# Patient Record
Sex: Female | Born: 1950
Health system: Southern US, Community
[De-identification: ages and names within clinical notes are randomized; demographics above are authoritative.]

## PROBLEM LIST (undated history)

## (undated) DIAGNOSIS — E0590 Thyrotoxicosis, unspecified without thyrotoxic crisis or storm: Secondary | ICD-10-CM

## (undated) DIAGNOSIS — E785 Hyperlipidemia, unspecified: Secondary | ICD-10-CM

## (undated) DIAGNOSIS — I10 Essential (primary) hypertension: Secondary | ICD-10-CM

## (undated) DIAGNOSIS — F411 Generalized anxiety disorder: Secondary | ICD-10-CM

---

## 2001-07-04 ENCOUNTER — Emergency Department (HOSPITAL_COMMUNITY): Admission: EM | Admit: 2001-07-04 | Discharge: 2001-07-04 | Payer: Self-pay | Admitting: *Deleted

## 2003-12-14 ENCOUNTER — Encounter: Admission: RE | Admit: 2003-12-14 | Discharge: 2003-12-14 | Payer: Self-pay | Admitting: Family Medicine

## 2004-01-23 ENCOUNTER — Emergency Department (HOSPITAL_COMMUNITY): Admission: EM | Admit: 2004-01-23 | Discharge: 2004-01-23 | Payer: Self-pay | Admitting: Emergency Medicine

## 2005-01-08 ENCOUNTER — Encounter: Admission: RE | Admit: 2005-01-08 | Discharge: 2005-01-08 | Payer: Self-pay | Admitting: Family Medicine

## 2006-03-15 ENCOUNTER — Encounter: Admission: RE | Admit: 2006-03-15 | Discharge: 2006-03-15 | Payer: Self-pay | Admitting: Family Medicine

## 2006-09-09 ENCOUNTER — Encounter: Admission: RE | Admit: 2006-09-09 | Discharge: 2006-09-09 | Payer: Self-pay | Admitting: Occupational Medicine

## 2008-05-30 ENCOUNTER — Encounter: Admission: RE | Admit: 2008-05-30 | Discharge: 2008-05-30 | Payer: Self-pay | Admitting: Occupational Medicine

## 2010-01-31 ENCOUNTER — Encounter: Admission: RE | Admit: 2010-01-31 | Discharge: 2010-01-31 | Payer: Self-pay | Admitting: Family Medicine

## 2010-07-26 IMAGING — CR DG KNEE COMPLETE 4+V*R*
4 series · 4 of 4 positions shown · non-contrast
Comparison: None

CLINICAL DATA: Fall with pain and swelling.

RIGHT KNEE - COMPLETE 4+ VIEW

[view not recorded (1 of 4)]
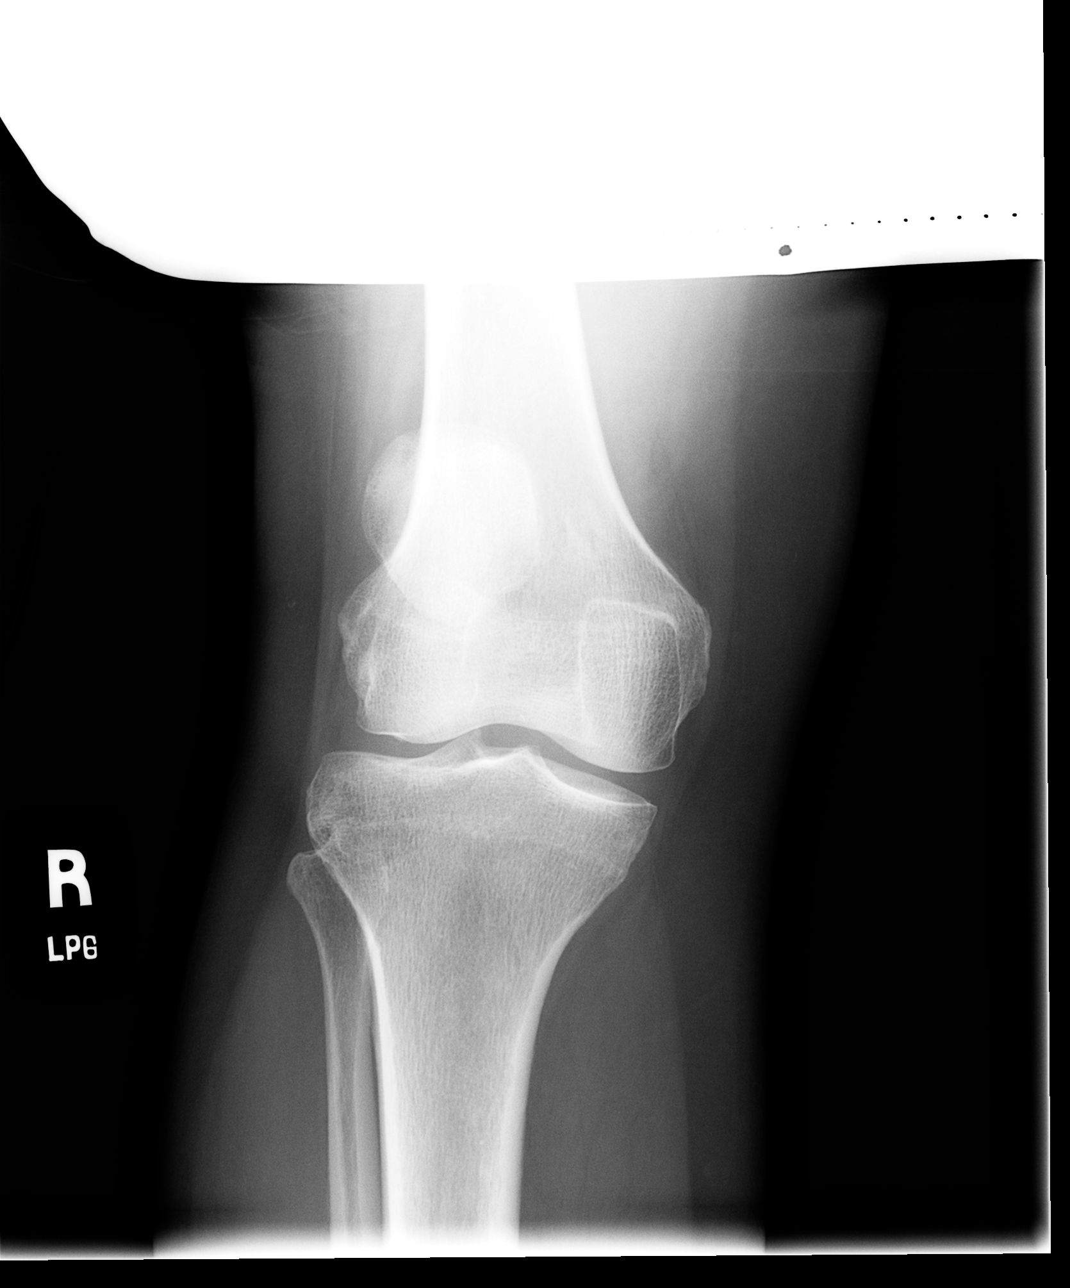

[view not recorded (2 of 4)]
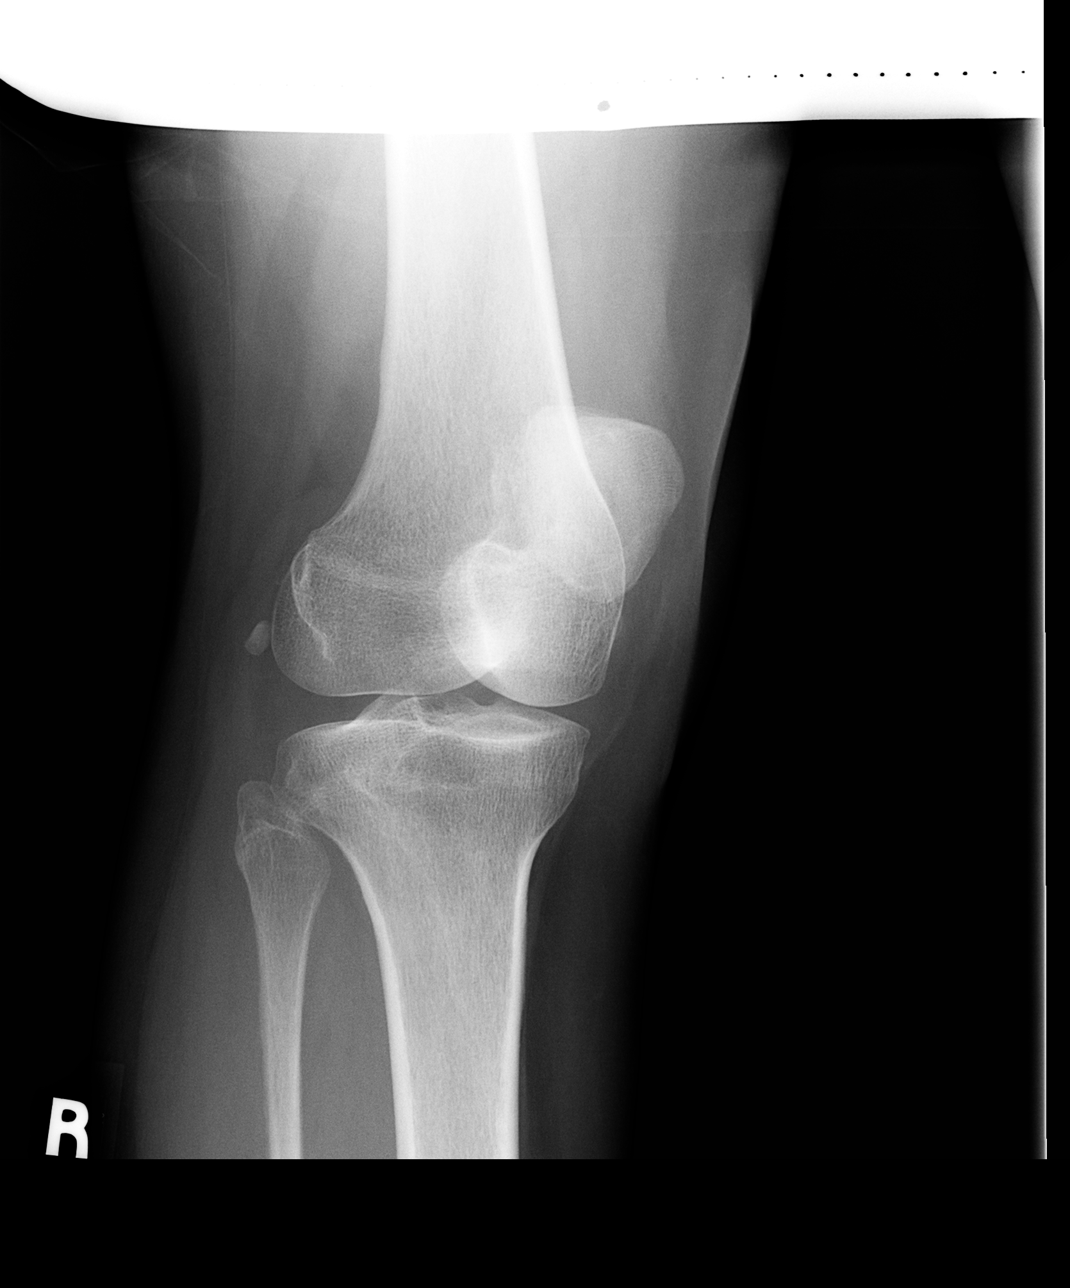

[view not recorded (3 of 4)]
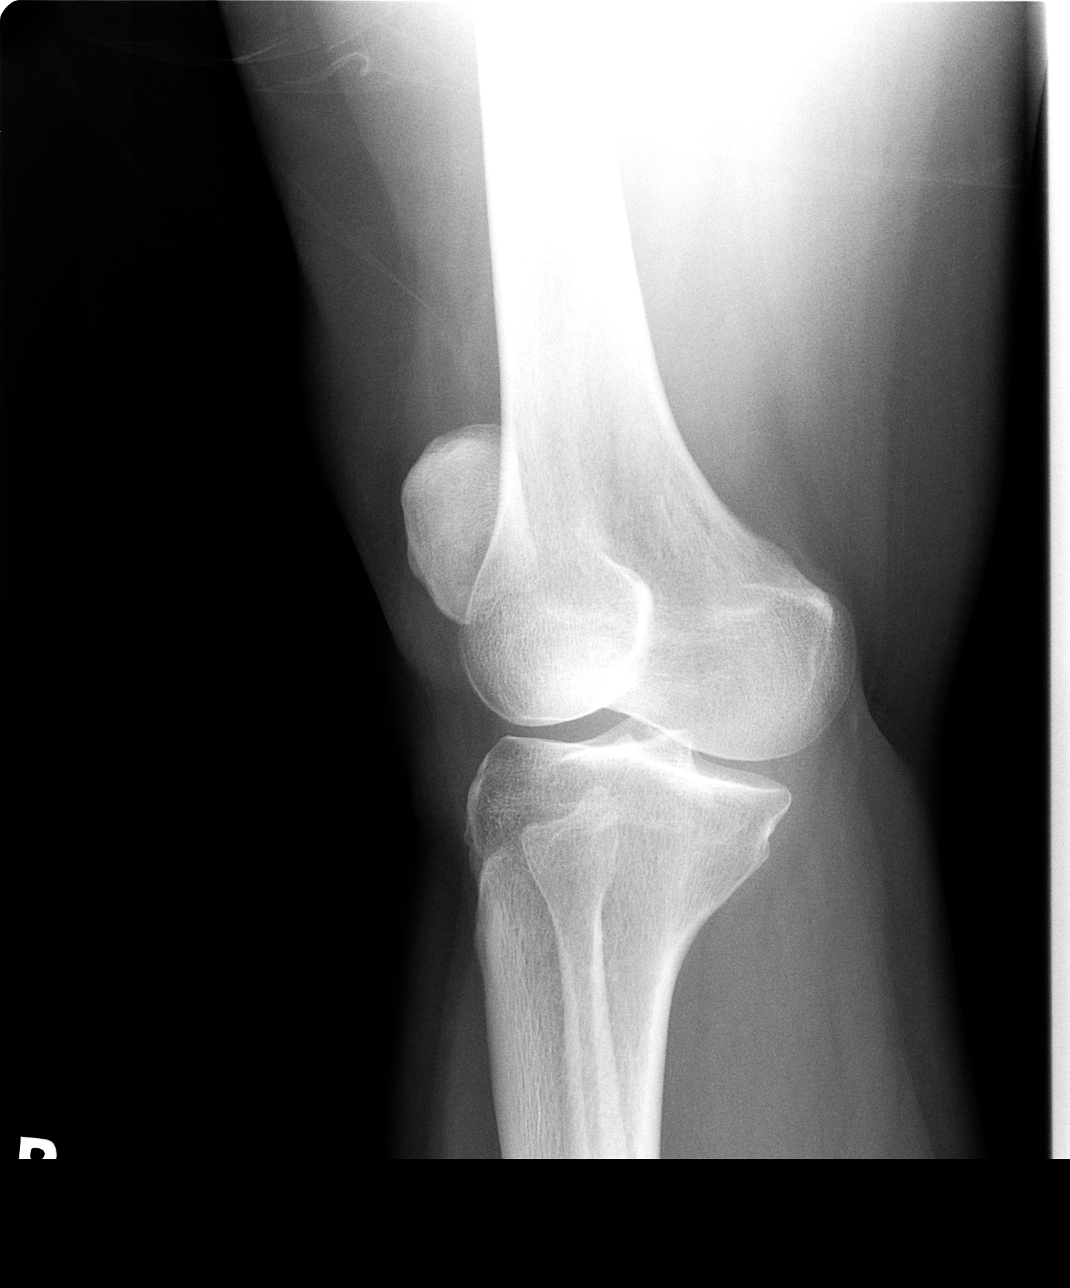

[view not recorded (4 of 4)]
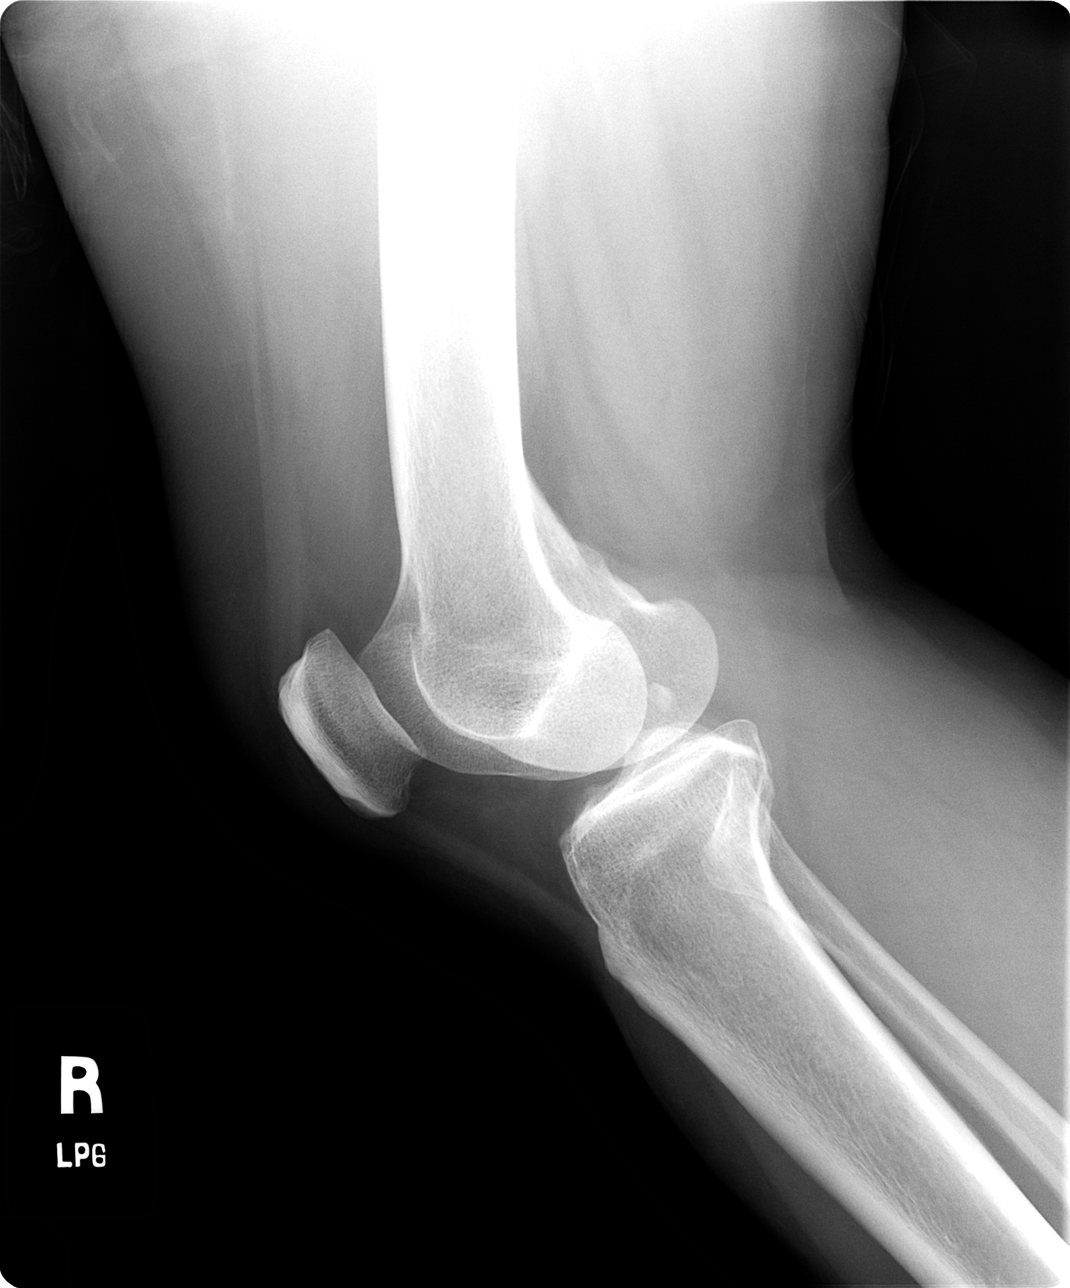

[4 of 4 positions shown; findings below may reference images not displayed]

FINDINGS: No joint effusion or fracture.  Medial and lateral
compartment joint space appears maintained.  Minimal subchondral
sclerosis.  There may be mild prepatellar soft tissue swelling.
IMPRESSION: 1.  Mild prepatellar soft tissue swelling without underlying
fracture.

## 2018-05-23 DIAGNOSIS — F321 Major depressive disorder, single episode, moderate: Secondary | ICD-10-CM | POA: Diagnosis not present

## 2018-05-23 DIAGNOSIS — F411 Generalized anxiety disorder: Secondary | ICD-10-CM | POA: Diagnosis not present

## 2018-05-23 DIAGNOSIS — R102 Pelvic and perineal pain: Secondary | ICD-10-CM | POA: Diagnosis not present

## 2018-05-23 DIAGNOSIS — N952 Postmenopausal atrophic vaginitis: Secondary | ICD-10-CM | POA: Diagnosis not present

## 2018-05-23 DIAGNOSIS — Z01419 Encounter for gynecological examination (general) (routine) without abnormal findings: Secondary | ICD-10-CM | POA: Diagnosis not present

## 2018-06-04 DIAGNOSIS — Z1389 Encounter for screening for other disorder: Secondary | ICD-10-CM | POA: Diagnosis not present

## 2018-06-04 DIAGNOSIS — E559 Vitamin D deficiency, unspecified: Secondary | ICD-10-CM | POA: Diagnosis not present

## 2018-06-04 DIAGNOSIS — I10 Essential (primary) hypertension: Secondary | ICD-10-CM | POA: Diagnosis not present

## 2018-06-04 DIAGNOSIS — F411 Generalized anxiety disorder: Secondary | ICD-10-CM | POA: Diagnosis not present

## 2018-06-04 DIAGNOSIS — R5383 Other fatigue: Secondary | ICD-10-CM | POA: Diagnosis not present

## 2018-09-23 DIAGNOSIS — F411 Generalized anxiety disorder: Secondary | ICD-10-CM | POA: Diagnosis not present

## 2018-09-23 DIAGNOSIS — N952 Postmenopausal atrophic vaginitis: Secondary | ICD-10-CM | POA: Diagnosis not present

## 2018-09-23 DIAGNOSIS — I10 Essential (primary) hypertension: Secondary | ICD-10-CM | POA: Diagnosis not present

## 2018-09-23 DIAGNOSIS — E0590 Thyrotoxicosis, unspecified without thyrotoxic crisis or storm: Secondary | ICD-10-CM | POA: Diagnosis not present

## 2018-09-23 DIAGNOSIS — E559 Vitamin D deficiency, unspecified: Secondary | ICD-10-CM | POA: Diagnosis not present

## 2019-01-16 DIAGNOSIS — E0590 Thyrotoxicosis, unspecified without thyrotoxic crisis or storm: Secondary | ICD-10-CM | POA: Diagnosis not present

## 2019-01-16 DIAGNOSIS — F411 Generalized anxiety disorder: Secondary | ICD-10-CM | POA: Diagnosis not present

## 2019-01-16 DIAGNOSIS — I10 Essential (primary) hypertension: Secondary | ICD-10-CM | POA: Diagnosis not present

## 2019-01-16 DIAGNOSIS — N952 Postmenopausal atrophic vaginitis: Secondary | ICD-10-CM | POA: Diagnosis not present

## 2019-01-16 DIAGNOSIS — F321 Major depressive disorder, single episode, moderate: Secondary | ICD-10-CM | POA: Diagnosis not present

## 2019-03-24 DIAGNOSIS — E0590 Thyrotoxicosis, unspecified without thyrotoxic crisis or storm: Secondary | ICD-10-CM | POA: Diagnosis not present

## 2019-03-24 DIAGNOSIS — Z719 Counseling, unspecified: Secondary | ICD-10-CM | POA: Diagnosis not present

## 2019-03-24 DIAGNOSIS — I10 Essential (primary) hypertension: Secondary | ICD-10-CM | POA: Diagnosis not present

## 2019-03-24 DIAGNOSIS — E559 Vitamin D deficiency, unspecified: Secondary | ICD-10-CM | POA: Diagnosis not present

## 2019-03-24 DIAGNOSIS — F411 Generalized anxiety disorder: Secondary | ICD-10-CM | POA: Diagnosis not present

## 2019-03-25 DIAGNOSIS — E559 Vitamin D deficiency, unspecified: Secondary | ICD-10-CM | POA: Diagnosis not present

## 2019-03-25 DIAGNOSIS — E0590 Thyrotoxicosis, unspecified without thyrotoxic crisis or storm: Secondary | ICD-10-CM | POA: Diagnosis not present

## 2019-03-25 DIAGNOSIS — I10 Essential (primary) hypertension: Secondary | ICD-10-CM | POA: Diagnosis not present

## 2019-08-07 DIAGNOSIS — Z20828 Contact with and (suspected) exposure to other viral communicable diseases: Secondary | ICD-10-CM | POA: Diagnosis not present

## 2019-10-18 DIAGNOSIS — F321 Major depressive disorder, single episode, moderate: Secondary | ICD-10-CM | POA: Diagnosis not present

## 2019-10-18 DIAGNOSIS — Z03818 Encounter for observation for suspected exposure to other biological agents ruled out: Secondary | ICD-10-CM | POA: Diagnosis not present

## 2019-10-18 DIAGNOSIS — Z6829 Body mass index (BMI) 29.0-29.9, adult: Secondary | ICD-10-CM | POA: Diagnosis not present

## 2019-10-18 DIAGNOSIS — I10 Essential (primary) hypertension: Secondary | ICD-10-CM | POA: Diagnosis not present

## 2019-10-18 DIAGNOSIS — E0590 Thyrotoxicosis, unspecified without thyrotoxic crisis or storm: Secondary | ICD-10-CM | POA: Diagnosis not present

## 2019-10-18 DIAGNOSIS — E049 Nontoxic goiter, unspecified: Secondary | ICD-10-CM | POA: Diagnosis not present

## 2020-06-08 ENCOUNTER — Other Ambulatory Visit (HOSPITAL_COMMUNITY): Payer: Self-pay | Admitting: Family Medicine

## 2020-06-08 DIAGNOSIS — Z0001 Encounter for general adult medical examination with abnormal findings: Secondary | ICD-10-CM | POA: Diagnosis not present

## 2020-06-08 DIAGNOSIS — E0590 Thyrotoxicosis, unspecified without thyrotoxic crisis or storm: Secondary | ICD-10-CM | POA: Diagnosis not present

## 2020-06-08 DIAGNOSIS — Z6829 Body mass index (BMI) 29.0-29.9, adult: Secondary | ICD-10-CM | POA: Diagnosis not present

## 2020-06-08 DIAGNOSIS — Z1331 Encounter for screening for depression: Secondary | ICD-10-CM | POA: Diagnosis not present

## 2020-06-08 DIAGNOSIS — I10 Essential (primary) hypertension: Secondary | ICD-10-CM | POA: Diagnosis not present

## 2020-06-08 DIAGNOSIS — E785 Hyperlipidemia, unspecified: Secondary | ICD-10-CM | POA: Diagnosis not present

## 2020-06-08 DIAGNOSIS — E049 Nontoxic goiter, unspecified: Secondary | ICD-10-CM | POA: Diagnosis not present

## 2020-06-08 DIAGNOSIS — R635 Abnormal weight gain: Secondary | ICD-10-CM | POA: Diagnosis not present

## 2020-06-08 DIAGNOSIS — F321 Major depressive disorder, single episode, moderate: Secondary | ICD-10-CM | POA: Diagnosis not present

## 2020-08-30 ENCOUNTER — Ambulatory Visit: Payer: Self-pay | Attending: Internal Medicine

## 2020-08-30 ENCOUNTER — Other Ambulatory Visit (HOSPITAL_BASED_OUTPATIENT_CLINIC_OR_DEPARTMENT_OTHER): Payer: Self-pay | Admitting: Internal Medicine

## 2020-08-30 DIAGNOSIS — Z23 Encounter for immunization: Secondary | ICD-10-CM

## 2020-08-30 NOTE — Progress Notes (Signed)
   Covid-19 Vaccination Clinic  Name:  Patricia Little    MRN: [id number] DOB: 10/30/1950  08/30/2020  Patricia Little was observed post Covid-19 immunization for 15 minutes without incident. She was provided with Vaccine Information Sheet and instruction to access the V-Safe system.  Vaccinated by [facility]  Patricia Little was instructed to call 911 with any severe reactions post vaccine: Marland Kitchen Difficulty breathing  . Swelling of face and throat  . A fast heartbeat  . A bad rash all over body  . Dizziness and weakness

## 2020-12-12 DIAGNOSIS — Z20822 Contact with and (suspected) exposure to covid-19: Secondary | ICD-10-CM | POA: Diagnosis not present

## 2021-01-08 DIAGNOSIS — Z20822 Contact with and (suspected) exposure to covid-19: Secondary | ICD-10-CM | POA: Diagnosis not present

## 2021-02-04 ENCOUNTER — Other Ambulatory Visit (HOSPITAL_COMMUNITY): Payer: Self-pay

## 2021-02-04 MED FILL — Losartan Potassium & Hydrochlorothiazide Tab 50-12.5 MG: ORAL | 30 days supply | Qty: 30 | Fill #0 | Status: AC

## 2021-02-04 MED FILL — Methimazole Tab 5 MG: ORAL | 30 days supply | Qty: 30 | Fill #0 | Status: AC

## 2021-02-06 ENCOUNTER — Other Ambulatory Visit (HOSPITAL_COMMUNITY): Payer: Self-pay

## 2021-02-10 ENCOUNTER — Other Ambulatory Visit (HOSPITAL_COMMUNITY): Payer: Self-pay

## 2021-02-11 ENCOUNTER — Other Ambulatory Visit (HOSPITAL_COMMUNITY): Payer: Self-pay

## 2021-02-11 MED FILL — Paroxetine HCl Tab ER 24HR 25 MG: ORAL | 30 days supply | Qty: 30 | Fill #0 | Status: CN

## 2021-03-10 ENCOUNTER — Other Ambulatory Visit (HOSPITAL_COMMUNITY): Payer: Self-pay

## 2021-03-10 MED FILL — Methimazole Tab 5 MG: ORAL | 30 days supply | Qty: 30 | Fill #1 | Status: AC

## 2021-03-10 MED FILL — Paroxetine HCl Tab ER 24HR 25 MG: ORAL | 30 days supply | Qty: 30 | Fill #0 | Status: AC

## 2021-03-11 ENCOUNTER — Other Ambulatory Visit (HOSPITAL_COMMUNITY): Payer: Self-pay

## 2021-03-14 ENCOUNTER — Other Ambulatory Visit (HOSPITAL_COMMUNITY): Payer: Self-pay

## 2021-03-19 ENCOUNTER — Other Ambulatory Visit (HOSPITAL_COMMUNITY): Payer: Self-pay

## 2021-04-04 ENCOUNTER — Other Ambulatory Visit (HOSPITAL_COMMUNITY): Payer: Self-pay

## 2021-04-04 DIAGNOSIS — E049 Nontoxic goiter, unspecified: Secondary | ICD-10-CM | POA: Diagnosis not present

## 2021-04-04 DIAGNOSIS — F411 Generalized anxiety disorder: Secondary | ICD-10-CM | POA: Diagnosis not present

## 2021-04-04 DIAGNOSIS — I10 Essential (primary) hypertension: Secondary | ICD-10-CM | POA: Diagnosis not present

## 2021-04-04 DIAGNOSIS — E785 Hyperlipidemia, unspecified: Secondary | ICD-10-CM | POA: Diagnosis not present

## 2021-04-04 DIAGNOSIS — F321 Major depressive disorder, single episode, moderate: Secondary | ICD-10-CM | POA: Diagnosis not present

## 2021-04-04 DIAGNOSIS — E0590 Thyrotoxicosis, unspecified without thyrotoxic crisis or storm: Secondary | ICD-10-CM | POA: Diagnosis not present

## 2021-04-04 MED ORDER — LOSARTAN POTASSIUM-HCTZ 50-12.5 MG PO TABS
1.0000 | ORAL_TABLET | Freq: Every day | ORAL | 2 refills | Status: DC
  Filled 2021-04-04: qty 90, 90d supply, fill #0
  Filled 2021-07-01: qty 90, 90d supply, fill #1
  Filled 2021-10-01: qty 90, 90d supply, fill #2

## 2021-04-04 MED ORDER — PAROXETINE HCL ER 25 MG PO TB24
25.0000 mg | ORAL_TABLET | Freq: Every day | ORAL | 2 refills | Status: AC
  Filled 2021-04-04: qty 30, 30d supply, fill #0
  Filled 2021-07-01: qty 30, 30d supply, fill #1
  Filled 2021-08-14: qty 30, 30d supply, fill #2
  Filled 2021-09-16: qty 30, 30d supply, fill #3
  Filled 2021-10-01: qty 30, 30d supply, fill #4
  Filled 2021-12-25: qty 30, 30d supply, fill #5
  Filled 2022-02-02 – 2022-02-23 (×2): qty 30, 30d supply, fill #6
  Filled 2022-03-26: qty 30, 30d supply, fill #7

## 2021-04-04 MED ORDER — METHIMAZOLE 5 MG PO TABS
5.0000 mg | ORAL_TABLET | Freq: Every day | ORAL | 4 refills | Status: AC
  Filled 2021-04-04: qty 90, 90d supply, fill #0
  Filled 2021-07-01: qty 90, 90d supply, fill #1
  Filled 2021-10-01: qty 90, 90d supply, fill #2
  Filled 2021-12-25: qty 90, 90d supply, fill #3
  Filled 2022-03-11: qty 90, 90d supply, fill #4

## 2021-04-22 DIAGNOSIS — E042 Nontoxic multinodular goiter: Secondary | ICD-10-CM | POA: Diagnosis not present

## 2021-04-22 DIAGNOSIS — Z13820 Encounter for screening for osteoporosis: Secondary | ICD-10-CM | POA: Diagnosis not present

## 2021-04-22 DIAGNOSIS — Z780 Asymptomatic menopausal state: Secondary | ICD-10-CM | POA: Diagnosis not present

## 2021-06-06 ENCOUNTER — Other Ambulatory Visit (HOSPITAL_COMMUNITY): Payer: Self-pay

## 2021-07-01 ENCOUNTER — Other Ambulatory Visit (HOSPITAL_COMMUNITY): Payer: Self-pay

## 2021-08-04 ENCOUNTER — Other Ambulatory Visit (HOSPITAL_BASED_OUTPATIENT_CLINIC_OR_DEPARTMENT_OTHER): Payer: Self-pay

## 2021-08-04 MED ORDER — INFLUENZA VAC A&B SA ADJ QUAD 0.5 ML IM PRSY
PREFILLED_SYRINGE | INTRAMUSCULAR | 0 refills | Status: DC
  Filled 2021-08-04: qty 0.5, 1d supply, fill #0

## 2021-08-14 ENCOUNTER — Other Ambulatory Visit (HOSPITAL_COMMUNITY): Payer: Self-pay

## 2021-09-16 ENCOUNTER — Other Ambulatory Visit (HOSPITAL_COMMUNITY): Payer: Self-pay

## 2021-10-01 ENCOUNTER — Other Ambulatory Visit (HOSPITAL_COMMUNITY): Payer: Self-pay

## 2021-10-02 ENCOUNTER — Other Ambulatory Visit (HOSPITAL_COMMUNITY): Payer: Self-pay

## 2021-10-03 ENCOUNTER — Other Ambulatory Visit (HOSPITAL_COMMUNITY): Payer: Self-pay

## 2021-10-21 ENCOUNTER — Other Ambulatory Visit (HOSPITAL_COMMUNITY): Payer: Self-pay

## 2021-10-21 MED ORDER — PAROXETINE HCL ER 25 MG PO TB24
25.0000 mg | ORAL_TABLET | Freq: Every day | ORAL | 2 refills | Status: DC
  Filled 2021-10-21: qty 90, 90d supply, fill #0
  Filled 2022-04-29: qty 30, 30d supply, fill #0
  Filled 2022-06-08: qty 30, 30d supply, fill #1

## 2021-10-21 MED ORDER — METHIMAZOLE 5 MG PO TABS
5.0000 mg | ORAL_TABLET | Freq: Every day | ORAL | 4 refills | Status: DC
  Filled 2021-10-21 – 2022-06-08 (×2): qty 90, 90d supply, fill #0

## 2021-10-21 MED ORDER — LOSARTAN POTASSIUM-HCTZ 50-12.5 MG PO TABS
1.0000 | ORAL_TABLET | Freq: Every day | ORAL | 2 refills | Status: AC
  Filled 2021-10-21 – 2021-12-25 (×2): qty 90, 90d supply, fill #0
  Filled 2022-03-16: qty 90, 90d supply, fill #1
  Filled 2022-06-08: qty 90, 90d supply, fill #2

## 2021-11-29 DIAGNOSIS — Z5320 Procedure and treatment not carried out because of patient's decision for unspecified reasons: Secondary | ICD-10-CM | POA: Diagnosis not present

## 2021-11-29 DIAGNOSIS — E785 Hyperlipidemia, unspecified: Secondary | ICD-10-CM | POA: Diagnosis not present

## 2021-11-29 DIAGNOSIS — F321 Major depressive disorder, single episode, moderate: Secondary | ICD-10-CM | POA: Diagnosis not present

## 2021-11-29 DIAGNOSIS — E049 Nontoxic goiter, unspecified: Secondary | ICD-10-CM | POA: Diagnosis not present

## 2021-11-29 DIAGNOSIS — R635 Abnormal weight gain: Secondary | ICD-10-CM | POA: Diagnosis not present

## 2021-11-29 DIAGNOSIS — E559 Vitamin D deficiency, unspecified: Secondary | ICD-10-CM | POA: Diagnosis not present

## 2021-11-29 DIAGNOSIS — Z6828 Body mass index (BMI) 28.0-28.9, adult: Secondary | ICD-10-CM | POA: Diagnosis not present

## 2021-11-29 DIAGNOSIS — I10 Essential (primary) hypertension: Secondary | ICD-10-CM | POA: Diagnosis not present

## 2021-11-29 DIAGNOSIS — E0590 Thyrotoxicosis, unspecified without thyrotoxic crisis or storm: Secondary | ICD-10-CM | POA: Diagnosis not present

## 2021-12-01 ENCOUNTER — Other Ambulatory Visit (HOSPITAL_COMMUNITY): Payer: Self-pay

## 2021-12-01 MED ORDER — ROSUVASTATIN CALCIUM 10 MG PO TABS
10.0000 mg | ORAL_TABLET | Freq: Every day | ORAL | 1 refills | Status: AC
  Filled 2021-12-01 – 2021-12-25 (×2): qty 90, 90d supply, fill #0
  Filled 2022-06-08: qty 90, 90d supply, fill #1

## 2021-12-09 ENCOUNTER — Other Ambulatory Visit (HOSPITAL_COMMUNITY): Payer: Self-pay

## 2021-12-25 ENCOUNTER — Other Ambulatory Visit (HOSPITAL_COMMUNITY): Payer: Self-pay

## 2021-12-25 MED ORDER — AMOXICILLIN 250 MG PO CAPS
250.0000 mg | ORAL_CAPSULE | ORAL | 0 refills | Status: DC
  Filled 2021-12-25: qty 40, 10d supply, fill #0

## 2022-01-08 ENCOUNTER — Other Ambulatory Visit (HOSPITAL_COMMUNITY): Payer: Self-pay

## 2022-01-08 MED ORDER — PENICILLIN V POTASSIUM 500 MG PO TABS
500.0000 mg | ORAL_TABLET | Freq: Four times a day (QID) | ORAL | 0 refills | Status: DC
  Filled 2022-01-08: qty 28, 7d supply, fill #0

## 2022-01-08 MED ORDER — HYDROCODONE-ACETAMINOPHEN 5-325 MG PO TABS
1.0000 | ORAL_TABLET | ORAL | 0 refills | Status: DC | PRN
  Filled 2022-01-08: qty 14, 3d supply, fill #0

## 2022-02-02 ENCOUNTER — Other Ambulatory Visit (HOSPITAL_COMMUNITY): Payer: Self-pay

## 2022-02-08 DIAGNOSIS — H524 Presbyopia: Secondary | ICD-10-CM | POA: Diagnosis not present

## 2022-02-10 ENCOUNTER — Other Ambulatory Visit (HOSPITAL_COMMUNITY): Payer: Self-pay

## 2022-02-23 ENCOUNTER — Other Ambulatory Visit (HOSPITAL_COMMUNITY): Payer: Self-pay

## 2022-03-11 ENCOUNTER — Other Ambulatory Visit (HOSPITAL_COMMUNITY): Payer: Self-pay

## 2022-03-16 ENCOUNTER — Other Ambulatory Visit (HOSPITAL_COMMUNITY): Payer: Self-pay

## 2022-03-26 ENCOUNTER — Other Ambulatory Visit (HOSPITAL_COMMUNITY): Payer: Self-pay

## 2022-04-29 ENCOUNTER — Other Ambulatory Visit (HOSPITAL_COMMUNITY): Payer: Self-pay

## 2022-04-30 ENCOUNTER — Other Ambulatory Visit (HOSPITAL_COMMUNITY): Payer: Self-pay

## 2022-06-08 ENCOUNTER — Other Ambulatory Visit (HOSPITAL_COMMUNITY): Payer: Self-pay

## 2022-07-15 ENCOUNTER — Other Ambulatory Visit (HOSPITAL_COMMUNITY): Payer: Self-pay

## 2022-07-16 ENCOUNTER — Other Ambulatory Visit (HOSPITAL_COMMUNITY): Payer: Self-pay

## 2022-08-18 ENCOUNTER — Other Ambulatory Visit (HOSPITAL_COMMUNITY): Payer: Self-pay

## 2024-09-09 ENCOUNTER — Other Ambulatory Visit: Payer: Self-pay

## 2024-09-09 ENCOUNTER — Observation Stay (HOSPITAL_BASED_OUTPATIENT_CLINIC_OR_DEPARTMENT_OTHER)
Admission: EM | Admit: 2024-09-09 | Discharge: 2024-09-11 | Disposition: A | Discharge status: Home / Self Care | Attending: Emergency Medicine | Admitting: Emergency Medicine

## 2024-09-09 ENCOUNTER — Emergency Department (HOSPITAL_BASED_OUTPATIENT_CLINIC_OR_DEPARTMENT_OTHER)

## 2024-09-09 ENCOUNTER — Encounter (HOSPITAL_BASED_OUTPATIENT_CLINIC_OR_DEPARTMENT_OTHER): Payer: Self-pay

## 2024-09-09 DIAGNOSIS — D72829 Elevated white blood cell count, unspecified: Secondary | ICD-10-CM | POA: Diagnosis present

## 2024-09-09 DIAGNOSIS — E0590 Thyrotoxicosis, unspecified without thyrotoxic crisis or storm: Secondary | ICD-10-CM | POA: Diagnosis not present

## 2024-09-09 DIAGNOSIS — K047 Periapical abscess without sinus: Secondary | ICD-10-CM | POA: Diagnosis not present

## 2024-09-09 DIAGNOSIS — F32A Depression, unspecified: Secondary | ICD-10-CM | POA: Insufficient documentation

## 2024-09-09 DIAGNOSIS — H53141 Visual discomfort, right eye: Secondary | ICD-10-CM | POA: Diagnosis present

## 2024-09-09 DIAGNOSIS — Z8639 Personal history of other endocrine, nutritional and metabolic disease: Secondary | ICD-10-CM

## 2024-09-09 DIAGNOSIS — I10 Essential (primary) hypertension: Secondary | ICD-10-CM | POA: Diagnosis not present

## 2024-09-09 DIAGNOSIS — Z79899 Other long term (current) drug therapy: Secondary | ICD-10-CM | POA: Insufficient documentation

## 2024-09-09 DIAGNOSIS — F419 Anxiety disorder, unspecified: Secondary | ICD-10-CM | POA: Insufficient documentation

## 2024-09-09 DIAGNOSIS — G459 Transient cerebral ischemic attack, unspecified: Principal | ICD-10-CM

## 2024-09-09 DIAGNOSIS — H5461 Unqualified visual loss, right eye, normal vision left eye: Secondary | ICD-10-CM | POA: Diagnosis present

## 2024-09-09 DIAGNOSIS — Z8679 Personal history of other diseases of the circulatory system: Secondary | ICD-10-CM

## 2024-09-09 DIAGNOSIS — E785 Hyperlipidemia, unspecified: Secondary | ICD-10-CM | POA: Diagnosis not present

## 2024-09-09 HISTORY — DX: Thyrotoxicosis, unspecified without thyrotoxic crisis or storm: E05.90

## 2024-09-09 HISTORY — DX: Generalized anxiety disorder: F41.1

## 2024-09-09 HISTORY — DX: Hyperlipidemia, unspecified: E78.5

## 2024-09-09 HISTORY — DX: Essential (primary) hypertension: I10

## 2024-09-09 LAB — DIFFERENTIAL
Abs Immature Granulocytes: 0.04 K/uL (ref 0.00–0.07)
Basophils Absolute: 0.1 K/uL (ref 0.0–0.1)
Basophils Relative: 1 %
Eosinophils Absolute: 0.1 K/uL (ref 0.0–0.5)
Eosinophils Relative: 1 %
Immature Granulocytes: 0 %
Lymphocytes Relative: 22 %
Lymphs Abs: 2.7 K/uL (ref 0.7–4.0)
Monocytes Absolute: 0.6 K/uL (ref 0.1–1.0)
Monocytes Relative: 5 %
Neutro Abs: 8.5 K/uL — ABNORMAL HIGH (ref 1.7–7.7)
Neutrophils Relative %: 71 %

## 2024-09-09 LAB — COMPREHENSIVE METABOLIC PANEL WITH GFR
ALT: 13 U/L (ref 0–44)
AST: 24 U/L (ref 15–41)
Albumin: 4.4 g/dL (ref 3.5–5.0)
Alkaline Phosphatase: 99 U/L (ref 38–126)
Anion gap: 11 (ref 5–15)
BUN: 15 mg/dL (ref 8–23)
CO2: 26 mmol/L (ref 22–32)
Calcium: 9.9 mg/dL (ref 8.9–10.3)
Chloride: 104 mmol/L (ref 98–111)
Creatinine, Ser: 0.86 mg/dL (ref 0.44–1.00)
GFR, Estimated: >60 mL/min (ref >60)
Glucose, Bld: 101 mg/dL — ABNORMAL HIGH (ref 70–99)
Potassium: 4.1 mmol/L (ref 3.5–5.1)
Sodium: 141 mmol/L (ref 135–145)
Total Bilirubin: 0.6 mg/dL (ref 0.0–1.2)
Total Protein: 7.9 g/dL (ref 6.5–8.1)

## 2024-09-09 LAB — URINALYSIS, ROUTINE W REFLEX MICROSCOPIC
Bacteria, UA: NONE SEEN
Bilirubin Urine: NEGATIVE
Glucose, UA: NEGATIVE mg/dL
Hgb urine dipstick: NEGATIVE
Ketones, ur: NEGATIVE mg/dL
Leukocytes,Ua: NEGATIVE
Nitrite: NEGATIVE
Protein, ur: NEGATIVE mg/dL
Specific Gravity, Urine: 1.044 — ABNORMAL HIGH (ref 1.005–1.030)
pH: 6.5 (ref 5.0–8.0)

## 2024-09-09 LAB — HEMOGLOBIN A1C
Hgb A1c MFr Bld: 5.8 % — ABNORMAL HIGH (ref 4.8–5.6)
Mean Plasma Glucose: 119.76 mg/dL

## 2024-09-09 LAB — URINE DRUG SCREEN
Amphetamines: NEGATIVE
Barbiturates: NEGATIVE
Benzodiazepines: NEGATIVE
Cocaine: NEGATIVE
Fentanyl: NEGATIVE
Methadone Scn, Ur: NEGATIVE
Opiates: NEGATIVE
Tetrahydrocannabinol: NEGATIVE

## 2024-09-09 LAB — CBC
HCT: 40.3 % (ref 36.0–46.0)
Hemoglobin: 13.3 g/dL (ref 12.0–15.0)
MCH: 29.6 pg (ref 26.0–34.0)
MCHC: 33 g/dL (ref 30.0–36.0)
MCV: 89.8 fL (ref 80.0–100.0)
Platelets: 260 K/uL (ref 150–400)
RBC: 4.49 MIL/uL (ref 3.87–5.11)
RDW: 14.1 % (ref 11.5–15.5)
WBC: 12 K/uL — ABNORMAL HIGH (ref 4.0–10.5)
nRBC: 0 % (ref 0.0–0.2)

## 2024-09-09 LAB — TSH: TSH: 4.08 u[IU]/mL (ref 0.350–4.500)

## 2024-09-09 LAB — PROTIME-INR
INR: 0.9 (ref 0.8–1.2)
Prothrombin Time: 12.1 s (ref 11.4–15.2)

## 2024-09-09 LAB — CBG MONITORING, ED: Glucose-Capillary: 97 mg/dL (ref 70–99)

## 2024-09-09 LAB — APTT: aPTT: 23 s — ABNORMAL LOW (ref 24–36)

## 2024-09-09 LAB — SEDIMENTATION RATE: Sed Rate: 16 mm/h (ref 0–22)

## 2024-09-09 LAB — C-REACTIVE PROTEIN: CRP: <0.5 mg/dL (ref <1.0)

## 2024-09-09 LAB — T4, FREE: Free T4: 0.73 ng/dL (ref 0.61–1.12)

## 2024-09-09 MED ORDER — AMOXICILLIN-POT CLAVULANATE 875-125 MG PO TABS
1.0000 | ORAL_TABLET | Freq: Once | ORAL | Status: AC
  Administered 2024-09-09: 1 via ORAL
  Filled 2024-09-09: qty 1

## 2024-09-09 MED ORDER — CLOPIDOGREL BISULFATE 300 MG PO TABS
300.0000 mg | ORAL_TABLET | Freq: Once | ORAL | Status: AC
  Administered 2024-09-09: 300 mg via ORAL
  Filled 2024-09-09: qty 1

## 2024-09-09 MED ORDER — IOHEXOL 350 MG/ML SOLN
75.0000 mL | Freq: Once | INTRAVENOUS | Status: AC | PRN
  Administered 2024-09-09: 75 mL via INTRAVENOUS

## 2024-09-09 NOTE — Progress Notes (Signed)
 Admission request: Admission for stroke.  Vitals:   09/09/24 1300 09/09/24 1302 09/09/24 1400 09/09/24 1700  BP:  (!) 182/75 (!) 155/63 (!) 139/50  Pulse:  86 68 65  Temp:  97.8 F (36.6 C)  (!) 97.5 F (36.4 C)  Resp:  20 14 14   Height: 4' 11 (1.499 m)     Weight: 67 kg     SpO2:  99% 100% 100%  TempSrc:  Oral  Oral  BMI (Calculated): 29.83      Vitals:   09/09/24 1302 09/09/24 1400 09/09/24 1700  BP: (!) 182/75 (!) 155/63 (!) 139/50   Chart review shows patient woke up with loss of vision in the right eye at 730 AM which has partially returned now his blurred vision last known normal was 2300 on 09/08/2024.  Vision loss was significant patient was only able to see lights shapes.  No reports of facial droop speech weakness balance issues trouble walking incontinence or falls.  Per chart review, EKG sinus rhythm at 83 with low voltage diffusely. Head CT noncontrast is normal for age. CTA head and neck negative for LVO or aneurysm but did show more mild RCA irregularity concerning for fibromuscular dysplasia, periapical abscess surrounding the roots of the residual left mandibular molar. MRI of the brain is negative for any acute intracranial abnormality. Admission requested for further evaluation of stroke and right eye visual symptoms. We will admit to neuro tele floor and get ophthalmology consult and requested temporal artery usg as well as esr ana crp , tft abx for tooth infection. Orders Placed This Encounter  Procedures   CT ANGIO HEAD NECK W WO CM   MR BRAIN WO CONTRAST   Protime-INR   APTT   CBC   Differential   Comprehensive metabolic panel   Rapid urine drug screen (hospital performed)   Urinalysis, Routine w reflex microscopic -Urine, Clean Catch   Sedimentation rate   C-reactive protein   Antinuclear Antibodies, IFA   TSH   T4, free   Hemoglobin A1c   Diet NPO time specified   Vital signs q 2 hours x 12 hours, then q 4 hours   ED Cardiac monitoring   NIH  Stroke Scale   Swallow screen   Initiate Carrier Fluid Protocol   Nurse notify provider if SBP > 220/120   If O2 sat <94% Administer O2 @ 2 Liters/Minute   Consult to neurology   Consult to hospitalist   ED Pulse oximetry, continuous   CBG monitoring, ED   ED EKG   EKG 12-Lead   Saline lock IV   Ophthalmology consult . Temporal artery USG if inflammatory markers are elevated.

## 2024-09-09 NOTE — Plan of Care (Signed)
 On-call neurology note  Called by ED APP Fonda Ruby regarding this patient with sudden onset of right eye monocular vision loss noted upon waking up this morning.  CT head, CTA head and neck done-reveals right ICA FMD.  MRI was available at the freestanding ER today which was done, MRI brain shows no evidence of acute stroke.  Given her risk factors of hypertension, hyperlipidemia and age, would recommend admission for stroke/TIA risk factor workup for possible ophthalmic TIA/CRAO. Not a candidate for thrombectomy due to no LVO.  Not a candidate for thrombolysis due to being outside the window  Admit to hospitalist at [facility]  Please call neurology once the patient arrives at [facility] so that she can be seen in inpatient consultation.  She will require the following: Admission to hospitalist Frequent neurochecks Telemetry 2D echo A1c Lipid panel Permissive hypertension-only treat if systolic is greater than 220 on a as needed basis for the next 48 to 72 hours.  Eventual goal would be normotension. Therapy assessments Can give her aspirin 650 and Plavix 300 load one-time and start her on aspirin 81 and Plavix 75 daily for 3 weeks after that.  Eligio Lav, MD Neurology

## 2024-09-09 NOTE — ED Provider Notes (Signed)
 Accepted handoff at shift change from [place], PA-C. Please see prior provider note for more detail.   Briefly: Patient is 73 y.o.   DDX: concern for TIA, stroke, CRAO, retinal detachment  Plan: Currently pending admission to hospitalist for TIA workup.   Physical Exam  BP (!) 139/50   Pulse 65   Temp (!) 97.5 F (36.4 C) (Oral)   Resp 14   Ht 4' 11 (1.499 m)   Wt 67 kg   SpO2 100%   BMI 29.84 kg/m   Physical Exam Vitals and nursing note reviewed.  Constitutional:      Appearance: She is well-developed.  HENT:     Head: Normocephalic and atraumatic.     Right Ear: Tympanic membrane, ear canal and external ear normal.     Left Ear: Tympanic membrane, ear canal and external ear normal.     Nose: Nose normal.     Mouth/Throat:     Mouth: Mucous membranes are moist.     Pharynx: Uvula midline.  Eyes:     General: Lids are normal.     Extraocular Movements: Extraocular movements intact.     Right eye: No nystagmus.     Left eye: No nystagmus.     Conjunctiva/sclera: Conjunctivae normal.     Pupils: Pupils are equal, round, and reactive to light.  Cardiovascular:     Rate and Rhythm: Normal rate and regular rhythm.  Pulmonary:     Effort: Pulmonary effort is normal.     Breath sounds: Normal breath sounds.  Abdominal:     Palpations: Abdomen is soft.     Tenderness: There is no abdominal tenderness.  Musculoskeletal:     Cervical back: Normal range of motion and neck supple. No tenderness or bony tenderness.  Skin:    General: Skin is warm and dry.  Neurological:     Mental Status: She is alert and oriented to person, place, and time.     GCS: GCS eye subscore is 4. GCS verbal subscore is 5. GCS motor subscore is 6.     Cranial Nerves: No cranial nerve deficit.     Sensory: No sensory deficit.     Motor: No weakness.     Coordination: Coordination normal.     Comments: Upper extremity myotomes tested bilaterally:  C5 Shoulder abduction 5/5 C6 Elbow flexion/wrist  extension 5/5 C7 Elbow extension 5/5 C8 Finger flexion 5/5 T1 Finger abduction 5/5  Lower extremity myotomes tested bilaterally: L2 Hip flexion 5/5 L3 Knee extension 5/5 L4 Ankle dorsiflexion 5/5 S1 Ankle plantar flexion 5/5      Procedures  Procedures  ED Course / MDM   Clinical Course as of 09/10/24 0021  Sat Sep 09, 2024  1854 Admit for TIA workup. Sxs started at 7:30am with blurry vision and back to baseline now on right side. Voncile has note with recommendations. Given Plavix but no ASA due to prior allergy history. RF: HTN, HLD. [OZ]    Clinical Course User Index [OZ] Cecily Legrand LABOR, PA-C   Medical Decision Making Amount and/or Complexity of Data Reviewed Labs: ordered. Radiology: ordered.  Risk Prescription drug management. Decision regarding hospitalization.   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  In brief, patient here with concerns of episode of monocular transient vision loss that occurred upon waking this morning.  Vision is back to baseline.  Current concern is for possible TIA.  Neurology was consulted with recommendations for admission for TIA workup.  Currently pending hospitalist admission.  Spoke with Dr. FORBES Blanch, hospitalist, who recommended adding on ESR, CRP, ANA and treating concern for periapical abscess. Orders placed and patient updated. Currently pending transfer to [facility] for admission.       Jericca Russett A, PA-C 09/09/24 2337    Rogelia Jerilynn RAMAN, MD 09/14/24 [phone]

## 2024-09-09 NOTE — ED Provider Notes (Signed)
 [place] EMERGENCY DEPARTMENT AT [facility] Provider Note   CSN: [id number] Arrival date & time: 09/09/24  1255     Patient presents with: Visual Field Change (Right eye)   Patricia Little is a 73 y.o. female.   Patient with history of hypertension, high cholesterol, and hypothyroidism --presents to the emergency department for evaluation of visual change.  Patient was in her usual state of health when she went to bed at around 11 PM last night.  She woke around 7:30 AM today.  She awoke with severely degraded visual acuity in her right eye.  She describes being able to see the light but was having difficulty distinguishing shapes and seeing clearly.  For instance, while driving, could see vague outlines of trees and cars, but cannot see them clearly.  They were planning on going up to [place] , however symptoms were persisting and they decided to come back for evaluation.  Currently she describes her vision as being 60 to 70% better.  She did not ever have full blindness in the eye or blindness in a part of her vision. Patient denies signs of stroke including: facial droop, slurred speech, aphasia, weakness/numbness in extremities, imbalance/trouble walking.  Patient was concerned that she may have slept on her eye and applied pressure to the area, causing her symptoms.  No eye pain.  No reported trauma.  Denies history of stroke.       Prior to Admission medications   Medication Sig Start Date End Date Taking? Authorizing Provider  amoxicillin  (AMOXIL ) 250 MG capsule Take 1 capsule (250 mg total) by mouth every 6 hours until gone . Start 4 days before appointment 12/25/21     HYDROcodone -acetaminophen  (NORCO/VICODIN) 5-325 MG tablet Take 1 tablet by mouth every 4-6 hours as needed for pain 01/08/22     influenza vaccine adjuvanted (FLUAD) 0.5 ML injection Inject into the muscle. 08/04/21   Luiz Channel, MD  losartan -hydrochlorothiazide  (HYZAAR ) 50-12.5 MG tablet Take 1  tablet by mouth daily. 10/20/21     methimazole  (TAPAZOLE ) 5 MG tablet TAKE 1 TABLET BY MOUTH ONCE DAILY. 06/08/20 06/08/21  Norleen Silver BROCKS, FNP  methimazole  (TAPAZOLE ) 5 MG tablet Take 1 tablet (5 mg total) by mouth daily. 04/04/21     methimazole  (TAPAZOLE ) 5 MG tablet Take 1 tablet (5 mg total) by mouth daily. 10/20/21     PARoxetine  (PAXIL  CR) 25 MG 24 hr tablet Take 1 tablet (25 mg total) by mouth daily. 04/04/21     PARoxetine  (PAXIL  CR) 25 MG 24 hr tablet Take 1 tablet (25 mg total) by mouth daily. 10/20/21     PARoxetine  (PAXIL -CR) 25 MG 24 hr tablet TAKE 1 TABLET BY MOUTH ONCE DAILY. 06/08/20 06/08/21  Norleen Silver BROCKS, FNP  penicillin  v potassium (VEETID) 500 MG tablet Take 1 tablet (500 mg total) by mouth 4 (four) times daily until complete 01/08/22     rosuvastatin  (CRESTOR ) 10 MG tablet Take 1 tablet (10 mg total) by mouth at bedtime. 12/01/21       Allergies: Miconazole nitrate and Nsaids    Review of Systems  Updated Vital Signs BP (!) 182/75 (BP Location: Left Arm)   Pulse 86   Temp 97.8 F (36.6 C) (Oral)   Resp 20   Ht 4' 11 (1.499 m)   Wt 67 kg   SpO2 99%   BMI 29.84 kg/m   Physical Exam Vitals and nursing note reviewed.  Constitutional:      Appearance: She is well-developed.  HENT:     Head: Normocephalic and atraumatic.     Right Ear: Tympanic membrane, ear canal and external ear normal.     Left Ear: Tympanic membrane, ear canal and external ear normal.     Nose: Nose normal.     Mouth/Throat:     Mouth: Mucous membranes are moist.     Pharynx: Uvula midline.  Eyes:     General: Lids are normal.     Extraocular Movements: Extraocular movements intact.     Right eye: No nystagmus.     Left eye: No nystagmus.     Conjunctiva/sclera: Conjunctivae normal.     Pupils: Pupils are equal, round, and reactive to light.  Cardiovascular:     Rate and Rhythm: Normal rate and regular rhythm.  Pulmonary:     Effort: Pulmonary effort is normal.     Breath sounds:  Normal breath sounds.  Abdominal:     Palpations: Abdomen is soft.     Tenderness: There is no abdominal tenderness.  Musculoskeletal:     Cervical back: Normal range of motion and neck supple. No tenderness or bony tenderness.  Skin:    General: Skin is warm and dry.  Neurological:     Mental Status: She is alert and oriented to person, place, and time.     GCS: GCS eye subscore is 4. GCS verbal subscore is 5. GCS motor subscore is 6.     Cranial Nerves: No cranial nerve deficit.     Sensory: No sensory deficit.     Motor: No weakness.     Coordination: Coordination normal.     Comments: Upper extremity myotomes tested bilaterally:  C5 Shoulder abduction 5/5 C6 Elbow flexion/wrist extension 5/5 C7 Elbow extension 5/5 C8 Finger flexion 5/5 T1 Finger abduction 5/5  Lower extremity myotomes tested bilaterally: L2 Hip flexion 5/5 L3 Knee extension 5/5 L4 Ankle dorsiflexion 5/5 S1 Ankle plantar flexion 5/5      (all labs ordered are listed, but only abnormal results are displayed) Labs Reviewed  APTT - Abnormal; Notable for the following components:      Result Value   aPTT 23 (*)    All other components within normal limits  CBC - Abnormal; Notable for the following components:   WBC 12.0 (*)    All other components within normal limits  DIFFERENTIAL - Abnormal; Notable for the following components:   Neutro Abs 8.5 (*)    All other components within normal limits  COMPREHENSIVE METABOLIC PANEL WITH GFR - Abnormal; Notable for the following components:   Glucose, Bld 101 (*)    All other components within normal limits  URINALYSIS, ROUTINE W REFLEX MICROSCOPIC - Abnormal; Notable for the following components:   Color, Urine COLORLESS (*)    Specific Gravity, Urine 1.044 (*)    All other components within normal limits  PROTIME-INR  URINE DRUG SCREEN  CBG MONITORING, ED    EKG: EKG Interpretation Date/Time:  Saturday September 09 2024 13:26:35 EST Ventricular  Rate:  83 PR Interval:  131 QRS Duration:  81 QT Interval:  363 QTC Calculation: 427 R Axis:   25  Text Interpretation: Sinus rhythm Low voltage, precordial leads No old tracing to compare Confirmed by Dean Clarity [phone]) on 09/09/2024 1:38:05 PM  Radiology: MR BRAIN WO CONTRAST Result Date: 09/09/2024 EXAM: MRI BRAIN WITHOUT CONTRAST 09/09/2024 04:24:52 PM TECHNIQUE: Multiplanar multisequence MRI of the head/brain was performed without the administration of intravenous contrast. COMPARISON: None available. CLINICAL HISTORY: Neuro  deficit, acute, stroke suspected. Abnormal vision in the right eye beginning today. FINDINGS: BRAIN AND VENTRICLES: No acute infarct. No intracranial hemorrhage. No mass. No midline shift. No hydrocephalus. Minimal periventricular T2 hyperintensity is within normal limits for age. The sella is unremarkable. Normal flow voids. ORBITS: No acute abnormality. SINUSES AND MASTOIDS: Mild mucosal thickening is present in the inferior left maxillary sinus. BONES AND SOFT TISSUES: Normal marrow signal. No acute soft tissue abnormality. IMPRESSION: 1. No acute intracranial abnormality explaining the right eye visual symptoms. Electronically signed by: Lonni Necessary MD 09/09/2024 04:55 PM EST RP Workstation: [id number]   CT ANGIO HEAD NECK W WO CM Addendum Date: 09/09/2024 ADDENDUM #1 ADDENDUM: The incorrect template was pulled. CT head without contrast was also performed the study is normal for age. ---------------------------------------------------- Electronically signed by: Lonni Necessary MD 09/09/2024 04:52 PM EST RP Workstation: [id number]   Result Date: 09/09/2024 ORIGINAL REPORT  EXAM: CTA HEAD AND NECK WITHOUT AND WITH 09/09/2024 02:25:02 PM TECHNIQUE: CTA of the head and neck was performed without and with the administration of 75 mL iohexol (OMNIPAQUE) 350 MG/ML injection. Multiplanar 2D and/or 3D reformatted images are provided for review. Automated  exposure control, iterative reconstruction, and/or weight based adjustment of the mA/kV was utilized to reduce the radiation dose to as low as reasonably achievable. Stenosis of the internal carotid arteries measured using NASCET criteria. COMPARISON: None available CLINICAL HISTORY: Neuro deficit, acute, stroke suspected. Abnormal vision in the right eye beginning today. FINDINGS: CTA NECK: AORTIC ARCH AND ARCH VESSELS: Sclerotic changes are present in the distal aortic arch. No focal aneurysm or stenosis is present. Right vessel origins are within normal limits. No dissection or arterial injury. CERVICAL CAROTID ARTERIES: Mild fibromuscular dysplasia (FMD) is present in the mid right internal carotid artery (ICA) raising concern for FMD. No focal stenosis is present. More subtle irregularity is present in the mid left internal carotid artery (ICA). No dissection or arterial injury. CERVICAL VERTEBRAL ARTERIES: The right vertebral artery is the dominant vessel. No dissection, arterial injury, or significant stenosis. LUNGS AND MEDIASTINUM: Unremarkable. SOFT TISSUES: Surrounds the roots of the residual left mandibular molar consistent with periapical abscess. BONES: No acute abnormality. CTA HEAD: ANTERIOR CIRCULATION: Atherosclerotic calcifications are present within the cavernous internal carotid arteries but actually without focal stenosis. No significant stenosis of the anterior cerebral arteries. No significant stenosis of the middle cerebral arteries. No aneurysm. POSTERIOR CIRCULATION: No significant stenosis of the posterior cerebral arteries. No significant stenosis of the basilar artery. No significant stenosis of the vertebral arteries. No aneurysm. OTHER: No dural venous sinus thrombosis on this non-dedicated study. IMPRESSION: 1. No large vessel occlusion, hemodynamically significant stenosis, or aneurysm in the head or neck. 2. Mild mid right ICA irregularity concerning for fibromuscular dysplasia,  without focal stenosis. More subtle irregularity in the mid left ICA likely represents the same . 3. Periapical abscess surrounding the roots of the residual left mandibular molar. Electronically signed by: Lonni Necessary MD 09/09/2024 02:40 PM EST RP Workstation: [id number]     Procedures   Medications Ordered in the ED  iohexol (OMNIPAQUE) 350 MG/ML injection 75 mL (75 mLs Intravenous Contrast Given 09/09/24 1416)  clopidogrel (PLAVIX) tablet 300 mg (300 mg Oral Given 09/09/24 1804)    ED Course  Patient seen and examined. History obtained directly from patient and daughter at bedside.   Labs/EKG: Ordered CBC with differential, CMP, coagulation panel as this is part of the stroke order set.  Imaging: Ordered CT angio head and neck.  Medications/Fluids: None ordered  Most recent vital signs reviewed and are as follows: BP (!) 182/75 (BP Location: Left Arm)   Pulse 86   Temp 97.8 F (36.6 C) (Oral)   Resp 20   Ht 4' 11 (1.499 m)   Wt 67 kg   SpO2 99%   BMI 29.84 kg/m     Visual Acuity  Right Eye Distance: (S) 20/40 Left Eye Distance: (S) 20/40 Bilateral Distance: (S) 20/40 (tested with glasses on)  Right Eye Near:   Left Eye Near:    Bilateral Near:     Initial impression: Visual acuity change of right eye, improving.  6:18 PM Reassessment performed. Patient appears stable on several rechecks, reports that vision is improving, essentially back to normal at this time.  Labs personally reviewed and interpreted including: CBC with elevated white blood cell count and normal hemoglobin; CMP glucose 101; PT/INR normal; APTT slightly elevated; UA without signs of infection; drug screen negative.  Imaging personally visualized and interpreted including: CT of the head and neck without any acute findings.  Follow-up MRI was ordered, negative for acute stroke.  I discussed case with on-call neurologist Dr. Arora.  We reviewed imaging.  Cannot rule out ophthalmic TIA.   Would recommend admission for TIA workup.  Recommends aspirin/Plavix load.  Plavix ordered, will hold aspirin given history of a breakout on her face.   Reviewed pertinent lab work and imaging with patient at bedside.  I discussed reassuring workup as well as recommendations from neurology with patient and daughter at bedside.  We discussed admission to the hospital for stroke evaluation and risk factor mitigation versus discharge with very close outpatient follow-up with neurology and ophthalmology.  After discussion, patient would like to be admitted.  Will discuss with hospitalist.  Appreciate neurology recommendations.  Most current vital signs reviewed and are as follows: BP (!) 139/50   Pulse 65   Temp (!) 97.5 F (36.4 C) (Oral)   Resp 14   Ht 4' 11 (1.499 m)   Wt 67 kg   SpO2 100%   BMI 29.84 kg/m   Plan: Admit to hospital.                                   Medical Decision Making Amount and/or Complexity of Data Reviewed Labs: ordered. Radiology: ordered.  Risk Prescription drug management. Decision regarding hospitalization.   Patient with reassuring workup however symptoms concerning for possible TIA involving the right eye.  She does have some risk factors.  Discussed with neurology, will Plavix load.  Will admit to hospitalist for further evaluation.     Final diagnoses:  TIA (transient ischemic attack)    ED Discharge Orders     None          Desiderio Chew, DEVONNA 09/09/24 TRENNA Dean Clarity, MD 09/10/24 [phone]

## 2024-09-09 NOTE — ED Triage Notes (Signed)
 Patient arrives with complaints of waking up with no vision in her right eye this morning at 0800. Patient states her vision has partially returned, but still blurred.  Patient states that her vision was at baseline yesterday. LKW 2300.

## 2024-09-09 NOTE — ED Notes (Signed)
Patient transport to MRI 

## 2024-09-10 MED ORDER — METHIMAZOLE 5 MG PO TABS: 5.0000 mg | ORAL_TABLET | Freq: Once | ORAL | Status: DC

## 2024-09-10 MED ORDER — ROSUVASTATIN CALCIUM 20 MG PO TABS
10.0000 mg | ORAL_TABLET | Freq: Every day | ORAL | Status: DC
  Administered 2024-09-11: 10 mg via ORAL
  Filled 2024-09-10: qty 1

## 2024-09-10 MED ORDER — PAROXETINE HCL ER 12.5 MG PO TB24
25.0000 mg | ORAL_TABLET | Freq: Every day | ORAL | Status: DC
  Filled 2024-09-10 (×2): qty 2

## 2024-09-10 NOTE — ED Notes (Addendum)
 Pt states she is not having any vision loss in her right eye at this time.

## 2024-09-10 NOTE — ED Provider Notes (Signed)
 Patient still resting comfortably while awaiting admission.  Patient denies further neurologic complaints at this time but she still agrees with plan for admission.  She tells me she does not want to go ED to ED to wait for her bed over at [facility] at this time.  Will allow her to wait here for her admission bed at this time.   Porter Moes, Lonni PARAS, MD 09/10/24 1038

## 2024-09-11 ENCOUNTER — Encounter (HOSPITAL_COMMUNITY): Payer: Self-pay | Admitting: Internal Medicine

## 2024-09-11 ENCOUNTER — Other Ambulatory Visit (HOSPITAL_COMMUNITY): Payer: Self-pay

## 2024-09-11 ENCOUNTER — Inpatient Hospital Stay (HOSPITAL_COMMUNITY)

## 2024-09-11 DIAGNOSIS — Z8679 Personal history of other diseases of the circulatory system: Secondary | ICD-10-CM | POA: Diagnosis not present

## 2024-09-11 DIAGNOSIS — E782 Mixed hyperlipidemia: Secondary | ICD-10-CM | POA: Diagnosis not present

## 2024-09-11 DIAGNOSIS — G459 Transient cerebral ischemic attack, unspecified: Secondary | ICD-10-CM | POA: Diagnosis present

## 2024-09-11 DIAGNOSIS — D72829 Elevated white blood cell count, unspecified: Secondary | ICD-10-CM | POA: Diagnosis present

## 2024-09-11 DIAGNOSIS — Z743 Need for continuous supervision: Secondary | ICD-10-CM | POA: Diagnosis not present

## 2024-09-11 DIAGNOSIS — K047 Periapical abscess without sinus: Secondary | ICD-10-CM | POA: Diagnosis present

## 2024-09-11 DIAGNOSIS — I10 Essential (primary) hypertension: Secondary | ICD-10-CM

## 2024-09-11 DIAGNOSIS — G453 Amaurosis fugax: Secondary | ICD-10-CM | POA: Diagnosis not present

## 2024-09-11 DIAGNOSIS — H5461 Unqualified visual loss, right eye, normal vision left eye: Secondary | ICD-10-CM

## 2024-09-11 DIAGNOSIS — Z8639 Personal history of other endocrine, nutritional and metabolic disease: Secondary | ICD-10-CM

## 2024-09-11 DIAGNOSIS — H538 Other visual disturbances: Secondary | ICD-10-CM | POA: Diagnosis not present

## 2024-09-11 DIAGNOSIS — E785 Hyperlipidemia, unspecified: Secondary | ICD-10-CM | POA: Diagnosis present

## 2024-09-11 LAB — COMPREHENSIVE METABOLIC PANEL WITH GFR
ALT: 13 U/L (ref 0–44)
AST: 17 U/L (ref 15–41)
Albumin: 3.6 g/dL (ref 3.5–5.0)
Alkaline Phosphatase: 72 U/L (ref 38–126)
Anion gap: 10 (ref 5–15)
BUN: 15 mg/dL (ref 8–23)
CO2: 24 mmol/L (ref 22–32)
Calcium: 8.9 mg/dL (ref 8.9–10.3)
Chloride: 105 mmol/L (ref 98–111)
Creatinine, Ser: 0.83 mg/dL (ref 0.44–1.00)
GFR, Estimated: >60 mL/min (ref >60)
Glucose, Bld: 112 mg/dL — ABNORMAL HIGH (ref 70–99)
Potassium: 3.3 mmol/L — ABNORMAL LOW (ref 3.5–5.1)
Sodium: 139 mmol/L (ref 135–145)
Total Bilirubin: 0.9 mg/dL (ref 0.0–1.2)
Total Protein: 6.6 g/dL (ref 6.5–8.1)

## 2024-09-11 LAB — LIPID PANEL
Cholesterol: 134 mg/dL (ref 0–200)
HDL: 42 mg/dL (ref >40)
LDL Cholesterol: 82 mg/dL (ref 0–99)
Total CHOL/HDL Ratio: 3.2 ratio
Triglycerides: 49 mg/dL (ref <150)
VLDL: 10 mg/dL (ref 0–40)

## 2024-09-11 LAB — CBC WITH DIFFERENTIAL/PLATELET
Abs Immature Granulocytes: 0.03 K/uL (ref 0.00–0.07)
Basophils Absolute: 0.1 K/uL (ref 0.0–0.1)
Basophils Relative: 1 %
Eosinophils Absolute: 0.2 K/uL (ref 0.0–0.5)
Eosinophils Relative: 2 %
HCT: 37.9 % (ref 36.0–46.0)
Hemoglobin: 12.6 g/dL (ref 12.0–15.0)
Immature Granulocytes: 0 %
Lymphocytes Relative: 24 %
Lymphs Abs: 2.3 K/uL (ref 0.7–4.0)
MCH: 29.9 pg (ref 26.0–34.0)
MCHC: 33.2 g/dL (ref 30.0–36.0)
MCV: 90 fL (ref 80.0–100.0)
Monocytes Absolute: 0.5 K/uL (ref 0.1–1.0)
Monocytes Relative: 5 %
Neutro Abs: 6.5 K/uL (ref 1.7–7.7)
Neutrophils Relative %: 68 %
Platelets: 277 K/uL (ref 150–400)
RBC: 4.21 MIL/uL (ref 3.87–5.11)
RDW: 14.1 % (ref 11.5–15.5)
WBC: 9.6 K/uL (ref 4.0–10.5)
nRBC: 0 % (ref 0.0–0.2)

## 2024-09-11 LAB — ECHOCARDIOGRAM COMPLETE
AR max vel: 1.51 cm2
AV Peak grad: 14.7 mmHg
Ao pk vel: 1.92 m/s
Area-P 1/2: 3.06 cm2
Height: 59 in
S' Lateral: 2.7 cm
Weight: 2303.37 [oz_av]

## 2024-09-11 LAB — MAGNESIUM: Magnesium: 2 mg/dL (ref 1.7–2.4)

## 2024-09-11 MED ORDER — LABETALOL HCL 5 MG/ML IV SOLN: 10.0000 mg | INTRAVENOUS | Status: DC | PRN

## 2024-09-11 MED ORDER — AMOXICILLIN-POT CLAVULANATE 875-125 MG PO TABS
1.0000 | ORAL_TABLET | Freq: Two times a day (BID) | ORAL | Status: DC
  Administered 2024-09-11: 1 via ORAL
  Filled 2024-09-11: qty 1

## 2024-09-11 MED ORDER — METHIMAZOLE 5 MG PO TABS
5.0000 mg | ORAL_TABLET | Freq: Every day | ORAL | Status: DC
  Administered 2024-09-11: 5 mg via ORAL
  Filled 2024-09-11: qty 1

## 2024-09-11 MED ORDER — ACETAMINOPHEN 325 MG PO TABS: 650.0000 mg | ORAL_TABLET | Freq: Four times a day (QID) | ORAL | Status: DC | PRN

## 2024-09-11 MED ORDER — STROKE: EARLY STAGES OF RECOVERY BOOK
Freq: Once | Status: AC
  Filled 2024-09-11: qty 1

## 2024-09-11 MED ORDER — AMOXICILLIN-POT CLAVULANATE 875-125 MG PO TABS
1.0000 | ORAL_TABLET | Freq: Two times a day (BID) | ORAL | 0 refills | Status: AC
  Filled 2024-09-11: qty 14, 7d supply, fill #0

## 2024-09-11 MED ORDER — GLUCAGON HCL RDNA (DIAGNOSTIC) 1 MG IJ SOLR: 1.0000 mg | INTRAMUSCULAR | Status: DC | PRN

## 2024-09-11 MED ORDER — ACETAMINOPHEN 650 MG RE SUPP: 650.0000 mg | Freq: Four times a day (QID) | RECTAL | Status: DC | PRN

## 2024-09-11 MED ORDER — CILOSTAZOL 100 MG PO TABS
100.0000 mg | ORAL_TABLET | Freq: Two times a day (BID) | ORAL | Status: DC
  Administered 2024-09-11: 100 mg via ORAL
  Filled 2024-09-11 (×2): qty 1

## 2024-09-11 MED ORDER — ONDANSETRON HCL 4 MG/2ML IJ SOLN: 4.0000 mg | Freq: Four times a day (QID) | INTRAMUSCULAR | Status: DC | PRN

## 2024-09-11 MED ORDER — CLOPIDOGREL BISULFATE 75 MG PO TABS
75.0000 mg | ORAL_TABLET | Freq: Every day | ORAL | Status: DC
  Administered 2024-09-11: 75 mg via ORAL
  Filled 2024-09-11: qty 1

## 2024-09-11 MED ORDER — HYDRALAZINE HCL 20 MG/ML IJ SOLN: 10.0000 mg | INTRAMUSCULAR | Status: DC | PRN

## 2024-09-11 MED ORDER — CILOSTAZOL 100 MG PO TABS
100.0000 mg | ORAL_TABLET | Freq: Two times a day (BID) | ORAL | 0 refills | Status: AC
  Filled 2024-09-11: qty 42, 21d supply, fill #0

## 2024-09-11 MED ORDER — PAROXETINE HCL ER 12.5 MG PO TB24
25.0000 mg | ORAL_TABLET | Freq: Once | ORAL | Status: AC
  Administered 2024-09-11: 25 mg via ORAL
  Filled 2024-09-11: qty 2

## 2024-09-11 MED ORDER — CLOPIDOGREL BISULFATE 75 MG PO TABS
75.0000 mg | ORAL_TABLET | Freq: Every day | ORAL | 0 refills | Status: AC
  Filled 2024-09-11: qty 90, 90d supply, fill #0

## 2024-09-11 MED ORDER — MELATONIN 3 MG PO TABS
3.0000 mg | ORAL_TABLET | Freq: Every evening | ORAL | Status: DC | PRN
  Filled 2024-09-11: qty 1

## 2024-09-11 MED ORDER — ACETAMINOPHEN 500 MG PO TABS: 1000.0000 mg | ORAL_TABLET | Freq: Four times a day (QID) | ORAL | Status: DC | PRN

## 2024-09-11 NOTE — Plan of Care (Signed)

## 2024-09-11 NOTE — Plan of Care (Signed)

## 2024-09-11 NOTE — Progress Notes (Signed)
 Echocardiogram 2D Echocardiogram has been performed.  Patricia Little 09/11/2024, 10:03 AM

## 2024-09-11 NOTE — Progress Notes (Signed)
 STROKE TEAM PROGRESS NOTE   INTERIM HISTORY/SUBJECTIVE Patricia Little is a 73 y.o. with hx of HTN, HLD, hyperthyroidism, GAD presents with right sided vision blurriness. LKW 2300 on 09/08/24.  No acute events. States right eye painless transient vision blurriness has improved and states vision is back to baseline. Denies orbital pain. Aware of periapical abscess and dental infection, already on antibiotics, aware to follow up with dentistry.  MRI scan of the brain is negative for acute stroke.  CT angiogram shows no large vessel stenosis or occlusion.  Mandibular abscess is noted.  OBJECTIVE  CBC    Component Value Date/Time   WBC 9.6 09/11/2024 0412   RBC 4.21 09/11/2024 0412   HGB 12.6 09/11/2024 0412   HCT 37.9 09/11/2024 0412   PLT 277 09/11/2024 0412   MCV 90.0 09/11/2024 0412   MCH 29.9 09/11/2024 0412   MCHC 33.2 09/11/2024 0412   RDW 14.1 09/11/2024 0412   LYMPHSABS 2.3 09/11/2024 0412   MONOABS 0.5 09/11/2024 0412   EOSABS 0.2 09/11/2024 0412   BASOSABS 0.1 09/11/2024 0412    BMET    Component Value Date/Time   NA 139 09/11/2024 0412   K 3.3 (L) 09/11/2024 0412   CL 105 09/11/2024 0412   CO2 24 09/11/2024 0412   GLUCOSE 112 (H) 09/11/2024 0412   BUN 15 09/11/2024 0412   CREATININE 0.83 09/11/2024 0412   CALCIUM  8.9 09/11/2024 0412   GFRNONAA >60 09/11/2024 0412    IMAGING past 24 hours ECHOCARDIOGRAM COMPLETE Result Date: 09/11/2024    ECHOCARDIOGRAM REPORT   Patient Name:   Patricia Little Date of Exam: 09/11/2024 Medical Rec #:  [id number]             Height:       59.0 in Accession #:    [id number]            Weight:       144.0 lb Date of Birth:  Dec 12, 1950              BSA:          1.604 m Patient Age:    73 years              BP:           119/56 mmHg Patient Gender: F                     HR:           75 bpm. Exam Location:  Inpatient Procedure: 2D Echo, Cardiac Doppler and Color Doppler (Both Spectral and Color            Flow Doppler were  utilized during procedure). Indications:    TIA  History:        Patient has no prior history of Echocardiogram examinations.                 Risk Factors:Hypertension and Dyslipidemia.  Sonographer:    Merlynn Argyle Referring Phys: [id number] JUSTIN B HOWERTER IMPRESSIONS  1. Left ventricular ejection fraction, by estimation, is 60 to 65%. The left ventricle has normal function. The left ventricle has no regional wall motion abnormalities. Left ventricular diastolic parameters were normal.  2. Right ventricular systolic function is normal. The right ventricular size is normal. There is normal pulmonary artery systolic pressure.  3. The mitral valve is normal in structure. No evidence of mitral valve regurgitation. No evidence of mitral stenosis.  4. The aortic  valve is normal in structure. Aortic valve regurgitation is not visualized. No aortic stenosis is present.  5. The inferior vena cava is normal in size with greater than 50% respiratory variability, suggesting right atrial pressure of 3 mmHg. FINDINGS  Left Ventricle: Left ventricular ejection fraction, by estimation, is 60 to 65%. The left ventricle has normal function. The left ventricle has no regional wall motion abnormalities. The left ventricular internal cavity size was normal in size. There is  no left ventricular hypertrophy. Left ventricular diastolic parameters were normal. Right Ventricle: The right ventricular size is normal. No increase in right ventricular wall thickness. Right ventricular systolic function is normal. There is normal pulmonary artery systolic pressure. The tricuspid regurgitant velocity is 2.46 m/s, and  with an assumed right atrial pressure of 3 mmHg, the estimated right ventricular systolic pressure is 27.2 mmHg. Left Atrium: Left atrial size was normal in size. Right Atrium: Right atrial size was normal in size. Pericardium: There is no evidence of pericardial effusion. Mitral Valve: The mitral valve is normal in structure. No  evidence of mitral valve regurgitation. No evidence of mitral valve stenosis. Tricuspid Valve: The tricuspid valve is normal in structure. Tricuspid valve regurgitation is trivial. No evidence of tricuspid stenosis. Aortic Valve: The aortic valve is normal in structure. Aortic valve regurgitation is not visualized. No aortic stenosis is present. Aortic valve peak gradient measures 14.7 mmHg. Pulmonic Valve: The pulmonic valve was normal in structure. Pulmonic valve regurgitation is not visualized. No evidence of pulmonic stenosis. Aorta: The aortic root is normal in size and structure. Venous: The inferior vena cava is normal in size with greater than 50% respiratory variability, suggesting right atrial pressure of 3 mmHg. IAS/Shunts: No atrial level shunt detected by color flow Doppler.  LEFT VENTRICLE PLAX 2D LVIDd:         4.60 cm   Diastology LVIDs:         2.70 cm   LV e' medial:    9.32 cm/s LV PW:         0.80 cm   LV E/e' medial:  8.9 LV IVS:        0.80 cm   LV e' lateral:   13.05 cm/s LVOT diam:     1.60 cm   LV E/e' lateral: 6.3 LV SV:         64 LV SV Index:   40 LVOT Area:     2.01 cm LV IVRT:       123 msec  RIGHT VENTRICLE             IVC RV S prime:     19.60 cm/s  IVC diam: 1.40 cm TAPSE (M-mode): 2.8 cm                             PULMONARY VEINS                             Diastolic Velocity: 41.00 cm/s                             S/D Velocity:       2.10                             Systolic Velocity:  84.20 cm/s LEFT ATRIUM  Index        RIGHT ATRIUM           Index LA diam:        3.30 cm 2.06 cm/m   RA Area:     13.80 cm LA Vol (A2C):   36.6 ml 22.82 ml/m  RA Volume:   28.60 ml  17.83 ml/m LA Vol (A4C):   37.2 ml 23.19 ml/m LA Biplane Vol: 36.8 ml 22.94 ml/m  AORTIC VALVE AV Area (Vmax): 1.51 cm AV Vmax:        192.00 cm/s AV Peak Grad:   14.7 mmHg LVOT Vmax:      144.00 cm/s LVOT Vmean:     94.500 cm/s LVOT VTI:       0.318 m  AORTA Ao Root diam: 2.70 cm Ao Asc diam:  2.80  cm MITRAL VALVE                TRICUSPID VALVE MV Area (PHT): 3.06 cm     TR Peak grad:   24.2 mmHg MV Decel Time: 248 msec     TR Vmax:        246.00 cm/s MV E velocity: 82.60 cm/s MV A velocity: 139.00 cm/s  SHUNTS MV E/A ratio:  0.59         Systemic VTI:  0.32 m                             Systemic Diam: 1.60 cm Morene Brownie Electronically signed by Morene Brownie Signature Date/Time: 09/11/2024/1:07:30 PM    Final     Vitals:   09/11/24 0140 09/11/24 0427 09/11/24 0453 09/11/24 0814  BP:  120/79 (!) 119/56 (!) 142/69  Pulse:  91 62 80  Resp:    20  Temp:  98.4 F (36.9 C) 97.6 F (36.4 C) 98.7 F (37.1 C)  TempSrc:      SpO2:  95% 95% 98%  Weight: 65.3 kg     Height: 4' 11 (1.499 m)        PHYSICAL EXAM General:  Alert, well-nourished, well-developed patient in no acute distress Psych:  Mood and affect appropriate for situation CV: Regular rate and rhythm on monitor Respiratory:  Regular, unlabored respirations on room air  NEURO:  Mental Status: AA&Ox3, patient is able to give clear and coherent history Speech/Language: speech is without dysarthria or aphasia.  Cranial Nerves:  II: PERRL. Visual fields full.  III, IV, VI: EOMI. Eyelids elevate symmetrically.  V: Sensation is intact to light touch and symmetrical to face.  VII: Face is symmetrical resting and smiling VIII: hearing intact to voice. IX, X: Palate elevates symmetrically. Phonation is normal.  KP:Dynloizm shrug 5/5. XII: tongue is midline without fasciculations. Motor: 5/5 strength to all muscle groups tested.  Tone: is normal and bulk is normal Sensation- Intact to light touch bilaterally.  Coordination: FTN intact bilaterally, HKS: no ataxia in BLE.No drift.  Gait- deferred  Most Recent NIH 0     ASSESSMENT/PLAN  Ms. DARCELL YACOUB is a 73 y.o. female with history of HTN, GAD, HLD, hyperthyroidism admitted for Right amourosis fugax.  NIH on Admission 0  Right sided visual blurriness  c/f amaurosis fugax   CTA head & neck w/o LVO, mild R ICA and L ICA irregularity c/f FMD MRI  No acute intracranial abnormality  2D Echo EF 60-65%, no interatrial shunt LDL 82 HgbA1c 5.8 VTE prophylaxis - SCDs Start: 09/11/24  0205 No antithrombotic prior to admission, now on clopidogrel 75 mg daily and cilostazol 100 mg BID for 3 weeks (ASA allergy) and then Plavix alone. Therapy recommendations:  No follow up needed  Disposition:  Home Plan ophthalmology f/u outpatient   Hypertension Home meds:  Losartan  and HCTZ Stable Blood Pressure Goal: BP less than 220/110   Hyperlipidemia Home meds:  Crestor  10 mg, resumed in hospital but reports non-adherence to medication at home LDL 82, goal < 70 Encourage better adherence to statin therapy  Continue statin at discharge   Pre-diabetes Home meds:  None HgbA1c 5.8, goal < 7.0 CBGs Recommend close follow-up with PCP    Other Active Problems Periapical abscess/left mandibular molar infection: on Augmentin. Patient plans outpatient f/u with dentistry.    Hospital day # 0  I have personally obtained history,examined this patient, reviewed notes, independently viewed imaging studies, participated in medical decision making and plan of care.ROS completed by me personally and pertinent positives fully documented  I have made any additions or clarifications directly to the above note. Agree with note above.  Patient presented with transient right eye vision loss likely versus fugax and neurovascular workup was unremarkable.  Patient is allergic to aspirin hence recommend dual antiplatelet therapy of Plavix and cilostazol for 3 weeks followed by Plavix alone and aggressive risk factor modification.  Follow-up as an outpatient stroke clinic with nurse practitioner in 2 months.  Discussed with Dr. Caleen   I personally spent a total of 50 minutes in the care of the patient today including getting/reviewing separately obtained history, performing a  medically appropriate exam/evaluation, counseling and educating, placing orders, referring and communicating with other health care professionals, documenting clinical information in the EHR, independently interpreting results, and coordinating care.        Eather Popp, MD Medical Director [facility] Pager: [phone] 09/11/2024 4:52 PM   To contact Stroke Continuity provider, please refer to [email or web address]. After hours, contact General Neurology

## 2024-09-11 NOTE — ED Notes (Signed)
 Pt en route to [facility] w/ [facility] at this time

## 2024-09-11 NOTE — Evaluation (Signed)
 Occupational Therapy Evaluation/Discharge Patient Details Name: Patricia Little MRN: [id number] DOB: [birth year] Today's Date: 09/11/2024   History of Present Illness   Pt is a 73 y/o female presenting after R eye vision loss. MRI brain negative; admitted for TIA workup. PMH: HTN, HLD     Clinical Impressions PTA, pt lives with spouse, typically completely Independent with ADLs, IADLs and mobility without AD. Pt presents now at baseline for ADLs/mobility w/o safety concerns. Pt R eye vision back to baseline and able to manage all functional tasks without issues. Discussed signs/symptoms of CVA. No skilled therapy services needed at this time. Pt functionally appropriate for DC home once medically cleared.     If plan is discharge home, recommend the following:   Other (comment) (PRN)     Functional Status Assessment   Patient has not had a recent decline in their functional status     Equipment Recommendations   None recommended by OT     Recommendations for Other Services         Precautions/Restrictions   Precautions Precautions: None Restrictions Weight Bearing Restrictions Per Provider Order: No     Mobility Bed Mobility               General bed mobility comments: standing at sink on entry    Transfers Overall transfer level: Independent Equipment used: None                      Balance Overall balance assessment: Independent                                         ADL either performed or assessed with clinical judgement   ADL Overall ADL's : Independent                                       General ADL Comments: standing at sink on entry washing up. Pt able to manage grooming tasks at sink, mobilize around room and access items. Discussed CVA symptoms, current vision which is back to baseline     Vision Baseline Vision/History: 1 Wears glasses;0 No visual deficits Ability to See in  Adequate Light: 0 Adequate Patient Visual Report: No change from baseline Vision Assessment?: No apparent visual deficits Additional Comments: vision back to baseline, able to manage ADLs/navigate room without issues     Perception         Praxis         Pertinent Vitals/Pain Pain Assessment Pain Assessment: No/denies pain     Extremity/Trunk Assessment Upper Extremity Assessment Upper Extremity Assessment: Overall WFL for tasks assessed;Right hand dominant   Lower Extremity Assessment Lower Extremity Assessment: Overall WFL for tasks assessed   Cervical / Trunk Assessment Cervical / Trunk Assessment: Normal   Communication Communication Communication: No apparent difficulties   Cognition Arousal: Alert Behavior During Therapy: WFL for tasks assessed/performed Cognition: No apparent impairments                               Following commands: Intact       Cueing  General Comments   Cueing Techniques: Verbal cues      Exercises     Shoulder Instructions      Home Living  Family/patient expects to be discharged to:: Private residence Living Arrangements: Spouse/significant other Available Help at Discharge: Family Type of Home: Apartment Home Access: Level entry     Home Layout: One level     Bathroom Shower/Tub: Chief Strategy Officer: Standard     Home Equipment: None          Prior Functioning/Environment Prior Level of Function : Independent/Modified Independent;Driving             Mobility Comments: no AD ADLs Comments: Indep with ADLs, IADLs, active    OT Problem List:     OT Treatment/Interventions:        OT Goals(Current goals can be found in the care plan section)   Acute Rehab OT Goals Patient Stated Goal: prevent episodes like this from happening again OT Goal Formulation: All assessment and education complete, DC therapy   OT Frequency:       Co-evaluation              AM-PAC  OT 6 Clicks Daily Activity     Outcome Measure Help from another person eating meals?: None Help from another person taking care of personal grooming?: None Help from another person toileting, which includes using toliet, bedpan, or urinal?: None Help from another person bathing (including washing, rinsing, drying)?: None Help from another person to put on and taking off regular upper body clothing?: None Help from another person to put on and taking off regular lower body clothing?: None 6 Click Score: 24   End of Session Nurse Communication: Mobility status  Activity Tolerance: Patient tolerated treatment well Patient left: with call bell/phone within reach  OT Visit Diagnosis: Low vision, both eyes (H54.2)                Time: 9272-9260 OT Time Calculation (min): 12 min Charges:  OT General Charges $OT Visit: 1 Visit OT Evaluation $OT Eval Low Complexity: 1 Low  Mliss NOVAK, OTR/L Acute Rehab Services Office: [phone]   Mliss Fish 09/11/2024, 7:46 AM

## 2024-09-11 NOTE — Progress Notes (Signed)
 PROGRESS NOTE    Patricia Little  FMW:[id number] DOB: Jul 23, 1951 DOA: 09/09/2024 PCP: Pcp, No    Brief Narrative:   73 y.o. female with medical history significant for essential hypertension, hyperlipidemia, who is admitted to [facility] on 09/09/2024 by way of transfer from [facility] with vision loss in the right eye after presenting from home to [facility] c/o such. CT Head negative. CTA head and neck with no LVO. MRI Brain with no acute stroke  Case was discussed with ophthalmology who recommends outpatient follow-up as well.  Assessment & Plan:  Right-sided visual disturbance - Concerning for amaurosis fugax.  No evidence of acute stroke seen on the MRI brain. Discussed with Dr Octavia from [facility], who recommends outptn follow up within 1-2 days which I have notified the patient.  - LDL 82, A1c 5.8, TSH normal - Echocardiogram - UDS  Periapical abscess/left mandibular molar infection - Currently on Augmentin.  Pain control and outpatient dentist follow-up  Hyperlipidemia - Crestor   Essential hypertension - Permissive hypertension.  IV as needed  Hyperthyroidism -On methimazole   Depression/anxiety - On Paxil    DVT prophylaxis: SCDs Start: 09/11/24 0205      Code Status: Full Code Family Communication: Spouse at bedside, daughter over video call Status is: Inpatient Remains inpatient appropriate because: On  going neuro eval.    PT Follow up Recs:   Subjective:  Seen at bedside, denying any visual or any other symptoms at this time.  Examination:  General exam: Appears calm and comfortable  Respiratory system: Clear to auscultation. Respiratory effort normal. Cardiovascular system: S1 & S2 heard, RRR. No JVD, murmurs, rubs, gallops or clicks. No pedal edema. Gastrointestinal system: Abdomen is nondistended, soft and nontender. No organomegaly or masses felt. Normal bowel sounds heard. Central nervous system: Alert and oriented. No focal neurological  deficits. Extremities: Symmetric 5 x 5 power. Skin: No rashes, lesions or ulcers Psychiatry: Judgement and insight appear normal. Mood & affect appropriate.                Diet Orders (From admission, onward)     Start     Ordered   09/11/24 0205  Diet regular Room service appropriate? Yes; Fluid consistency: Thin  Diet effective now       Question Answer Comment  Room service appropriate? Yes   Fluid consistency: Thin      09/11/24 0204            Objective: Vitals:   09/11/24 0140 09/11/24 0427 09/11/24 0453 09/11/24 0814  BP:  120/79 (!) 119/56 (!) 142/69  Pulse:  91 62 80  Resp:    20  Temp:  98.4 F (36.9 C) 97.6 F (36.4 C) 98.7 F (37.1 C)  TempSrc:      SpO2:  95% 95% 98%  Weight: 65.3 kg     Height: 4' 11 (1.499 m)       Intake/Output Summary (Last 24 hours) at 09/11/2024 1303 Last data filed at 09/11/2024 0835 Gross per 24 hour  Intake 236 ml  Output --  Net 236 ml   Filed Weights   09/09/24 1300 09/11/24 0140  Weight: 67 kg 65.3 kg    Scheduled Meds:  amoxicillin -clavulanate  1 tablet Oral Q12H   clopidogrel  75 mg Oral Daily   methIMAzole   5 mg Oral Daily   PARoxetine   25 mg Oral Daily   rosuvastatin   10 mg Oral QHS   Continuous Infusions:  Nutritional status     Body  mass index is 29.08 kg/m.  Data Reviewed:   CBC: Recent Labs  Lab 09/09/24 1324 09/11/24 0412  WBC 12.0* 9.6  NEUTROABS 8.5* 6.5  HGB 13.3 12.6  HCT 40.3 37.9  MCV 89.8 90.0  PLT 260 277   Basic Metabolic Panel: Recent Labs  Lab 09/09/24 1324 09/11/24 0412  NA 141 139  K 4.1 3.3*  CL 104 105  CO2 26 24  GLUCOSE 101* 112*  BUN 15 15  CREATININE 0.86 0.83  CALCIUM  9.9 8.9  MG  --  2.0   GFR: Estimated Creatinine Clearance: 48.8 mL/min (by C-G formula based on SCr of 0.83 mg/dL). Liver Function Tests: Recent Labs  Lab 09/09/24 1324 09/11/24 0412  AST 24 17  ALT 13 13  ALKPHOS 99 72  BILITOT 0.6 0.9  PROT 7.9 6.6  ALBUMIN 4.4  3.6   No results for input(s): LIPASE, AMYLASE in the last 168 hours. No results for input(s): AMMONIA in the last 168 hours. Coagulation Profile: Recent Labs  Lab 09/09/24 1324  INR 0.9   Cardiac Enzymes: No results for input(s): CKTOTAL, CKMB, CKMBINDEX, TROPONINI in the last 168 hours. BNP (last 3 results) No results for input(s): PROBNP in the last 8760 hours. HbA1C: Recent Labs    09/09/24 1930  HGBA1C 5.8*   CBG: Recent Labs  Lab 09/09/24 1302  GLUCAP 97   Lipid Profile: Recent Labs    09/11/24 0412  CHOL 134  HDL 42  LDLCALC 82  TRIG 49  CHOLHDL 3.2   Thyroid  Function Tests: Recent Labs    09/09/24 1935  TSH 4.080  FREET4 0.73   Anemia Panel: No results for input(s): VITAMINB12, FOLATE, FERRITIN, TIBC, IRON, RETICCTPCT in the last 72 hours. Sepsis Labs: No results for input(s): PROCALCITON, LATICACIDVEN in the last 168 hours.  No results found for this or any previous visit (from the past 240 hours).       Radiology Studies:          LOS: 0 days   Time spent= 35 mins    Burgess JAYSON Dare, MD [facility]  If 7PM-7AM, please contact night-coverage  09/11/2024, 1:03 PM

## 2024-09-11 NOTE — Progress Notes (Addendum)
 Patient passed bedside swallow evaluation. Provider notified

## 2024-09-11 NOTE — Care Management Obs Status (Cosign Needed)
 MEDICARE OBSERVATION STATUS NOTIFICATION   Patient Details  Name: Patricia Little MRN: [id number] Date of Birth: Jul 07, 1951   Medicare Observation Status Notification Given:  Yes    Rosaline JONELLE Joe, RN 09/11/2024, 2:49 PM

## 2024-09-11 NOTE — Consult Note (Addendum)
 NEUROLOGY CONSULT NOTE   Date of service: September 11, 2024 Patient Name: Patricia Little MRN:  [id number] DOB:  05/27/1951 Chief Complaint: R eye blurred vision Requesting Provider: Marcene Eva NOVAK, DO  History of Present Illness  Patricia Little is a 73 y.o. female with hx of HTN, GAD, HLD, hyperthyroidism who woke up Saturday AM with R eye mono-ocular painless blurred vision. LKW 2300 09/08/24, when she went to bed.  This lasted about 10 hours and resolved in the evening. She spoke with her daughter who encouraged her to come to the ED.  CT Head negative. CTA head and neck with no LVO. MRI Brain with no acute stroke.  LKW: 2300 on 09/18/24 Modified rankin score: 0-Completely asymptomatic and back to baseline post- stroke IV Thrombolysis: not offered, full and complete resolution of symptoms. EVT: not offered, no LVO  NIHSS components Score: Comment  1a Level of Conscious 0[]  1[]  2[]  3[]      1b LOC Questions 0[]  1[]  2[]       1c LOC Commands 0[]  1[]  2[]       2 Best Gaze 0[]  1[]  2[]       3 Visual 0[]  1[]  2[]  3[]      4 Facial Palsy 0[]  1[]  2[]  3[]      5a Motor Arm - left 0[]  1[]  2[]  3[]  4[]  UN[]    5b Motor Arm - Right 0[]  1[]  2[]  3[]  4[]  UN[]    6a Motor Leg - Left 0[]  1[]  2[]  3[]  4[]  UN[]    6b Motor Leg - Right 0[]  1[]  2[]  3[]  4[]  UN[]    7 Limb Ataxia 0[]  1[]  2[]  UN[]      8 Sensory 0[]  1[]  2[]  UN[]      9 Best Language 0[]  1[]  2[]  3[]      10 Dysarthria 0[]  1[]  2[]  UN[]      11 Extinct. and Inattention 0[]  1[]  2[]       TOTAL: 0      ROS  Comprehensive ROS performed and pertinent positives documented in HPI   Past History   Past Medical History:  Diagnosis Date   Essential hypertension    GAD (generalized anxiety disorder)    HLD (hyperlipidemia)    Hyperthyroidism     History reviewed. No pertinent surgical history.  Family History: History reviewed. No pertinent family history.  Social History  reports that she has never smoked. She has never used  smokeless tobacco. She reports that she does not currently use alcohol. She reports that she does not use drugs.  Allergies  Allergen Reactions   Miconazole Nitrate Itching    Unknown reaction   Nsaids Rash and Dermatitis    Medications   Current Facility-Administered Medications:    acetaminophen  (TYLENOL ) tablet 1,000 mg, 1,000 mg, Oral, Q6H PRN **OR** acetaminophen  (TYLENOL ) suppository 650 mg, 650 mg, Rectal, Q6H PRN, Howerter, Justin B, DO   amoxicillin -clavulanate (AUGMENTIN) 875-125 MG per tablet 1 tablet, 1 tablet, Oral, Q12H, Howerter, Justin B, DO   clopidogrel (PLAVIX) tablet 75 mg, 75 mg, Oral, Daily, Howerter, Justin B, DO   hydrALAZINE (APRESOLINE) injection 10 mg, 10 mg, Intravenous, Q4H PRN, Howerter, Justin B, DO   melatonin tablet 3 mg, 3 mg, Oral, QHS PRN, Howerter, Justin B, DO   methimazole  (TAPAZOLE ) tablet 5 mg, 5 mg, Oral, Daily, Howerter, Justin B, DO, 5 mg at 09/11/24 0222   ondansetron (ZOFRAN) injection 4 mg, 4 mg, Intravenous, Q6H PRN, Howerter, Justin B, DO   PARoxetine  (PAXIL -CR) 24 hr tablet 25 mg, 25 mg, Oral, Daily, Zelaya,  Legrand LABOR, PA-C   rosuvastatin  (CRESTOR ) tablet 10 mg, 10 mg, Oral, QHS, Rogelia Jerilynn RAMAN, MD, 10 mg at 09/11/24 0221  Vitals   Vitals:   09/10/24 2111 09/10/24 2340 09/11/24 0126 09/11/24 0140  BP:  (!) 155/61 (!) 153/80   Pulse:  61 72   Resp:  16    Temp: 98.3 F (36.8 C)  97.6 F (36.4 C)   TempSrc:      SpO2:  98% 97%   Weight:    65.3 kg  Height:    4' 11 (1.499 m)    Body mass index is 29.08 kg/m.   Physical Exam   General: Laying comfortably in bed; in no acute distress.  HENT: Normal oropharynx and mucosa. Normal external appearance of ears and nose.  Neck: Supple, no pain or tenderness CV: No JVD. No peripheral edema.  Pulmonary: Symmetric Chest rise. Normal respiratory effort.  Abdomen: Soft to touch, non-tender.  Ext: No cyanosis, edema, or deformity  Skin: No rash. Normal palpation of skin.    Musculoskeletal: Normal digits and nails by inspection. No clubbing.   Neurologic Examination  Mental status/Cognition: Alert, oriented to self, place, month and year, good attention.  Speech/language: Fluent, comprehension intact, object naming intact, repetition intact.  Cranial nerves:   CN II Pupils equal and reactive to light, no VF deficits    CN III,IV,VI EOM intact, no gaze preference or deviation, no nystagmus    CN V normal sensation in V1, V2, and V3 segments bilaterally    CN VII no asymmetry, no nasolabial fold flattening    CN VIII normal hearing to speech    CN IX & X normal palatal elevation, no uvular deviation    CN XI 5/5 head turn and 5/5 shoulder shrug bilaterally    CN XII midline tongue protrusion    Motor:  Muscle bulk: normal, tone normal, pronator drift none tremor none Mvmt Root Nerve  Muscle Right Left Comments  SA C5/6 Ax Deltoid 5 5   EF C5/6 Mc Biceps 5 5   EE C6/7/8 Rad Triceps 5 5   WF C6/7 Med FCR     WE C7/8 PIN ECU     F Ab C8/T1 U ADM/FDI 5 5   HF L1/2/3 Fem Illopsoas 5 5   KE L2/3/4 Fem Quad 5 5   DF L4/5 D Peron Tib Ant 5 5   PF S1/2 Tibial Grc/Sol 5 5    Sensation:  Light touch Intact throughout   Pin prick    Temperature    Vibration   Proprioception    Coordination/Complex Motor:  - Finger to Nose intact BL - Heel to shin intact BL - Rapid alternating movement are normal - Gait: deferred.   Labs/Imaging/Neurodiagnostic studies   CBC:  Recent Labs  Lab Sep 16, 2024 1324  WBC 12.0*  NEUTROABS 8.5*  HGB 13.3  HCT 40.3  MCV 89.8  PLT 260   Basic Metabolic Panel:  Lab Results  Component Value Date   NA 141 Sep 16, 2024   K 4.1 09/16/2024   CO2 26 16-Sep-2024   GLUCOSE 101 (H) 09-16-24   BUN 15 2024-09-16   CREATININE 0.86 09/16/2024   CALCIUM  9.9 09/16/24   GFRNONAA >60 09-16-24   Lipid Panel: No results found for: LDLCALC HgbA1c:  Lab Results  Component Value Date   HGBA1C 5.8 (H) 09-16-24   Urine  Drug Screen:     Component Value Date/Time   LABOPIA NEGATIVE 16-Sep-2024 1539   COCAINSCRNUR  NEGATIVE 09/09/2024 1539   LABBENZ NEGATIVE 09/09/2024 1539   AMPHETMU NEGATIVE 09/09/2024 1539   THCU NEGATIVE 09/09/2024 1539   LABBARB NEGATIVE 09/09/2024 1539    Alcohol Level No results found for: [facility] INR  Lab Results  Component Value Date   INR 0.9 09/09/2024   APTT  Lab Results  Component Value Date   APTT 23 (L) 09/09/2024   AED levels: No results found for: PHENYTOIN, ZONISAMIDE, LAMOTRIGINE, LEVETIRACETA  CT Head without contrast(Personally reviewed): CTH was negative for a large hypodensity concerning for a large territory infarct or hyperdensity concerning for an ICH  CT angio Head and Neck with contrast(Personally reviewed): No LVO  MRI Brain(Personally reviewed): No acute abnormalities  ASSESSMENT   Patricia Little is a 73 y.o. female who presents with several hour episodes of painless R eye blurred vision with full and spontaneous resolution.   Episode does seem concerning for amourosis fugax.  Maybe other ophthalmic pathology that I am not well aware of? Recommend discussion with optho in AM.  RECOMMENDATIONS  - Frequent Neuro checks per stroke unit protocol - Recommend obtaining TTE  - Recommend obtaining Lipid panel with LDL - Please start statin if LDL > 70 - Recommend HbA1c to evaluate for diabetes and how well it is controlled. - Antithrombotic - Aspirin 81mg  daily. - Recommend DVT ppx - SBP goal - aim for gradual normotension. - Recommend Telemetry monitoring for arrythmia - Recommend bedside swallow screen prior to PO intake. - Stroke education booklet - Recommend PT/OT/SLP consult - Recommend Urine Tox screen.  - discuss with optho in AM.  ______________________________________________________________________    Signed, Monette Omara, MD [facility]

## 2024-09-11 NOTE — Hospital Course (Addendum)
 Brief Narrative:   73 y.o. female with medical history significant for essential hypertension, hyperlipidemia, who is admitted to [facility] on 09/09/2024 by way of transfer from [facility] with vision loss in the right eye after presenting from home to [facility] c/o such. CT Head negative. CTA head and neck with no LVO. MRI Brain with no acute stroke  Case was discussed with ophthalmology who recommends outpatient follow-up as well.  Due to allergy to aspirin, neurology is recommending Pletal and Plavix for 3 weeks followed by Plavix indefinitely.  Assessment & Plan:  Right-sided visual disturbance - Concerning for amaurosis fugax.  No evidence of acute stroke seen on the MRI brain. Discussed with Dr Octavia from [facility], who recommends outptn follow up within 1-2 days which I have notified the patient.  Due to allergy to aspirin, neurology is recommending Pletal and Plavix for 3 weeks followed by Plavix indefinitely. - LDL 82, A1c 5.8, TSH normal - Echocardiogram - UDS  Periapical abscess/left mandibular molar infection - Currently on Augmentin.  Pain control and outpatient dentist follow-up  Hyperlipidemia - Crestor   Essential hypertension - Permissive hypertension  Hyperthyroidism -On methimazole   Depression/anxiety - On Paxil    DVT prophylaxis: SCDs Start: 09/11/24 0205      Code Status: Full Code Family Communication: Spouse at bedside, daughter over video call Status is: Inpatient Remains inpatient appropriate because: On  going neuro eval.    PT Follow up Recs:   Subjective:  Seen at bedside, denying any visual or any other symptoms at this time.  Examination:  General exam: Appears calm and comfortable  Respiratory system: Clear to auscultation. Respiratory effort normal. Cardiovascular system: S1 & S2 heard, RRR. No JVD, murmurs, rubs, gallops or clicks. No pedal edema. Gastrointestinal system: Abdomen is nondistended, soft and nontender. No organomegaly or  masses felt. Normal bowel sounds heard. Central nervous system: Alert and oriented. No focal neurological deficits. Extremities: Symmetric 5 x 5 power. Skin: No rashes, lesions or ulcers Psychiatry: Judgement and insight appear normal. Mood & affect appropriate.

## 2024-09-11 NOTE — Discharge Summary (Signed)
 Physician Discharge Summary  Patricia Little FMW:[id number] DOB: 13-Aug-1951 DOA: 09/09/2024  PCP: Pcp, No  Admit date: 09/09/2024 Discharge date: 09/11/2024  Admitted From: Home Disposition:  Home  Recommendations for Outpatient Follow-up:  Follow up with PCP in 1-2 weeks Please obtain BMP/CBC in one week your next doctors visit.  Neurology recommending Pletal and Plavix for 3 weeks followed by Plavix.  Patient is allergic to aspirin. Outpatient follow-up with ophthalmology, Dr. Octavia tomorrow   Discharge Condition: Stable CODE STATUS: Full Diet recommendation: Cardiac  Brief/Interim Summary: Brief Narrative:   73 y.o. female with medical history significant for essential hypertension, hyperlipidemia, who is admitted to [facility] on 09/09/2024 by way of transfer from [facility] with vision loss in the right eye after presenting from home to [facility] c/o such. CT Head negative. CTA head and neck with no LVO. MRI Brain with no acute stroke  Case was discussed with ophthalmology who recommends outpatient follow-up as well.  Due to allergy to aspirin, neurology is recommending Pletal and Plavix for 3 weeks followed by Plavix indefinitely.  Assessment & Plan:  Right-sided visual disturbance - Concerning for amaurosis fugax.  No evidence of acute stroke seen on the MRI brain. Discussed with Dr Octavia from [facility], who recommends outptn follow up within 1-2 days which I have notified the patient.  Due to allergy to aspirin, neurology is recommending Pletal and Plavix for 3 weeks followed by Plavix indefinitely. - LDL 82, A1c 5.8, TSH normal - Echocardiogram - UDS  Periapical abscess/left mandibular molar infection - Currently on Augmentin.  Pain control and outpatient dentist follow-up  Hyperlipidemia - Crestor   Essential hypertension - Permissive hypertension  Hyperthyroidism -On methimazole   Depression/anxiety - On Paxil    DVT prophylaxis: SCDs Start: 09/11/24  0205      Code Status: Full Code Family Communication: Spouse at bedside, daughter over video call Status is: Inpatient Remains inpatient appropriate because: On  going neuro eval.    PT Follow up Recs:   Subjective:  Seen at bedside, denying any visual or any other symptoms at this time.  Examination:  General exam: Appears calm and comfortable  Respiratory system: Clear to auscultation. Respiratory effort normal. Cardiovascular system: S1 & S2 heard, RRR. No JVD, murmurs, rubs, gallops or clicks. No pedal edema. Gastrointestinal system: Abdomen is nondistended, soft and nontender. No organomegaly or masses felt. Normal bowel sounds heard. Central nervous system: Alert and oriented. No focal neurological deficits. Extremities: Symmetric 5 x 5 power. Skin: No rashes, lesions or ulcers Psychiatry: Judgement and insight appear normal. Mood & affect appropriate.    Discharge Diagnoses:  Principal Problem:   Vision loss of right eye Active Problems:   Leukocytosis   Dental abscess   History of essential hypertension   HLD (hyperlipidemia)   History of hyperthyroidism      Discharge Exam: Vitals:   09/11/24 0453 09/11/24 0814  BP: (!) 119/56 (!) 142/69  Pulse: 62 80  Resp:  20  Temp: 97.6 F (36.4 C) 98.7 F (37.1 C)  SpO2: 95% 98%   Vitals:   09/11/24 0140 09/11/24 0427 09/11/24 0453 09/11/24 0814  BP:  120/79 (!) 119/56 (!) 142/69  Pulse:  91 62 80  Resp:    20  Temp:  98.4 F (36.9 C) 97.6 F (36.4 C) 98.7 F (37.1 C)  TempSrc:      SpO2:  95% 95% 98%  Weight: 65.3 kg     Height: 4' 11 (1.499 m)  Discharge Instructions   Allergies as of 09/11/2024       Reactions   Latex Other (See Comments)   Irritation   Miconazole Nitrate Itching   Unknown reaction   Nsaids Rash, Dermatitis        Medication List     TAKE these medications    amoxicillin -clavulanate 875-125 MG tablet Commonly known as: AUGMENTIN Take 1 tablet by mouth  every 12 (twelve) hours for 7 days.   cilostazol 100 MG tablet Commonly known as: PLETAL Take 1 tablet (100 mg total) by mouth 2 (two) times daily for 21 days.   clopidogrel 75 MG tablet Commonly known as: PLAVIX Take 1 tablet (75 mg total) by mouth daily. Start taking on: September 12, 2024   losartan -hydrochlorothiazide  50-12.5 MG tablet Commonly known as: HYZAAR  Take 1 tablet by mouth daily.   methimazole  5 MG tablet Commonly known as: TAPAZOLE  Take 1 tablet (5 mg total) by mouth daily.   PARoxetine  25 MG 24 hr tablet Commonly known as: Paxil  CR Take 1 tablet (25 mg total) by mouth daily.   rosuvastatin  10 MG tablet Commonly known as: Crestor  Take 1 tablet (10 mg total) by mouth at bedtime.        Follow-up Information     Octavia Bruckner, MD Follow up in 1 day(s).   Specialty: Ophthalmology Contact information: [address] [place] [address] [phone]                Allergies  Allergen Reactions   Latex Other (See Comments)    Irritation   Miconazole Nitrate Itching    Unknown reaction   Nsaids Rash and Dermatitis    You were cared for by a hospitalist during your hospital stay. If you have any questions about your discharge medications or the care you received while you were in the hospital after you are discharged, you can call the unit and asked to speak with the hospitalist on call if the hospitalist that took care of you is not available. Once you are discharged, your primary care physician will handle any further medical issues. Please note that no refills for any discharge medications will be authorized once you are discharged, as it is imperative that you return to your primary care physician (or establish a relationship with a primary care physician if you do not have one) for your aftercare needs so that they can reassess your need for medications and monitor your lab values.  You were cared for by a hospitalist during your  hospital stay. If you have any questions about your discharge medications or the care you received while you were in the hospital after you are discharged, you can call the unit and asked to speak with the hospitalist on call if the hospitalist that took care of you is not available. Once you are discharged, your primary care physician will handle any further medical issues. Please note that NO REFILLS for any discharge medications will be authorized once you are discharged, as it is imperative that you return to your primary care physician (or establish a relationship with a primary care physician if you do not have one) for your aftercare needs so that they can reassess your need for medications and monitor your lab values.  Please request your Prim.MD to go over all Hospital Tests and Procedure/Radiological results at the follow up, please get all Hospital records sent to your Prim MD by signing hospital release before you go home.  Get  CBC, CMP, 2 view Chest X ray checked  by Primary MD during your next visit or SNF MD in 5-7 days ( we routinely change or add medications that can affect your baseline labs and fluid status, therefore we recommend that you get the mentioned basic workup next visit with your PCP, your PCP may decide not to get them or add new tests based on their clinical decision)  On your next visit with your primary care physician please Get Medicines reviewed and adjusted.  If you experience worsening of your admission symptoms, develop shortness of breath, life threatening emergency, suicidal or homicidal thoughts you must seek medical attention immediately by calling 911 or calling your MD immediately  if symptoms less severe.  You Must read complete instructions/literature along with all the possible adverse reactions/side effects for all the Medicines you take and that have been prescribed to you. Take any new Medicines after you have completely understood and accpet all the  possible adverse reactions/side effects.   Do not drive, operate heavy machinery, perform activities at heights, swimming or participation in water activities or provide baby sitting services if your were admitted for syncope or siezures until you have seen by Primary MD or a Neurologist and advised to do so again.  Do not drive when taking Pain medications.   Procedures/Studies: ECHOCARDIOGRAM COMPLETE Result Date: 09/11/2024    ECHOCARDIOGRAM REPORT   Patient Name:   FRANSHESCA CHIPMAN Date of Exam: 09/11/2024 Medical Rec #:  [id number]             Height:       59.0 in Accession #:    [id number]            Weight:       144.0 lb Date of Birth:  Jun 12, 1951              BSA:          1.604 m Patient Age:    73 years              BP:           119/56 mmHg Patient Gender: F                     HR:           75 bpm. Exam Location:  Inpatient Procedure: 2D Echo, Cardiac Doppler and Color Doppler (Both Spectral and Color            Flow Doppler were utilized during procedure). Indications:    TIA  History:        Patient has no prior history of Echocardiogram examinations.                 Risk Factors:Hypertension and Dyslipidemia.  Sonographer:    Merlynn Argyle Referring Phys: [id number] JUSTIN B HOWERTER IMPRESSIONS  1. Left ventricular ejection fraction, by estimation, is 60 to 65%. The left ventricle has normal function. The left ventricle has no regional wall motion abnormalities. Left ventricular diastolic parameters were normal.  2. Right ventricular systolic function is normal. The right ventricular size is normal. There is normal pulmonary artery systolic pressure.  3. The mitral valve is normal in structure. No evidence of mitral valve regurgitation. No evidence of mitral stenosis.  4. The aortic valve is normal in structure. Aortic valve regurgitation is not visualized. No aortic stenosis is present.  5. The inferior vena cava is normal in size with greater than 50% respiratory  variability, suggesting  right atrial pressure of 3 mmHg. FINDINGS  Left Ventricle: Left ventricular ejection fraction, by estimation, is 60 to 65%. The left ventricle has normal function. The left ventricle has no regional wall motion abnormalities. The left ventricular internal cavity size was normal in size. There is  no left ventricular hypertrophy. Left ventricular diastolic parameters were normal. Right Ventricle: The right ventricular size is normal. No increase in right ventricular wall thickness. Right ventricular systolic function is normal. There is normal pulmonary artery systolic pressure. The tricuspid regurgitant velocity is 2.46 m/s, and  with an assumed right atrial pressure of 3 mmHg, the estimated right ventricular systolic pressure is 27.2 mmHg. Left Atrium: Left atrial size was normal in size. Right Atrium: Right atrial size was normal in size. Pericardium: There is no evidence of pericardial effusion. Mitral Valve: The mitral valve is normal in structure. No evidence of mitral valve regurgitation. No evidence of mitral valve stenosis. Tricuspid Valve: The tricuspid valve is normal in structure. Tricuspid valve regurgitation is trivial. No evidence of tricuspid stenosis. Aortic Valve: The aortic valve is normal in structure. Aortic valve regurgitation is not visualized. No aortic stenosis is present. Aortic valve peak gradient measures 14.7 mmHg. Pulmonic Valve: The pulmonic valve was normal in structure. Pulmonic valve regurgitation is not visualized. No evidence of pulmonic stenosis. Aorta: The aortic root is normal in size and structure. Venous: The inferior vena cava is normal in size with greater than 50% respiratory variability, suggesting right atrial pressure of 3 mmHg. IAS/Shunts: No atrial level shunt detected by color flow Doppler.  LEFT VENTRICLE PLAX 2D LVIDd:         4.60 cm   Diastology LVIDs:         2.70 cm   LV e' medial:    9.32 cm/s LV PW:         0.80 cm   LV E/e' medial:  8.9 LV IVS:        0.80 cm    LV e' lateral:   13.05 cm/s LVOT diam:     1.60 cm   LV E/e' lateral: 6.3 LV SV:         64 LV SV Index:   40 LVOT Area:     2.01 cm LV IVRT:       123 msec  RIGHT VENTRICLE             IVC RV S prime:     19.60 cm/s  IVC diam: 1.40 cm TAPSE (M-mode): 2.8 cm                             PULMONARY VEINS                             Diastolic Velocity: 41.00 cm/s                             S/D Velocity:       2.10                             Systolic Velocity:  84.20 cm/s LEFT ATRIUM             Index        RIGHT ATRIUM  Index LA diam:        3.30 cm 2.06 cm/m   RA Area:     13.80 cm LA Vol (A2C):   36.6 ml 22.82 ml/m  RA Volume:   28.60 ml  17.83 ml/m LA Vol (A4C):   37.2 ml 23.19 ml/m LA Biplane Vol: 36.8 ml 22.94 ml/m  AORTIC VALVE AV Area (Vmax): 1.51 cm AV Vmax:        192.00 cm/s AV Peak Grad:   14.7 mmHg LVOT Vmax:      144.00 cm/s LVOT Vmean:     94.500 cm/s LVOT VTI:       0.318 m  AORTA Ao Root diam: 2.70 cm Ao Asc diam:  2.80 cm MITRAL VALVE                TRICUSPID VALVE MV Area (PHT): 3.06 cm     TR Peak grad:   24.2 mmHg MV Decel Time: 248 msec     TR Vmax:        246.00 cm/s MV E velocity: 82.60 cm/s MV A velocity: 139.00 cm/s  SHUNTS MV E/A ratio:  0.59         Systemic VTI:  0.32 m                             Systemic Diam: 1.60 cm Morene Brownie Electronically signed by Morene Brownie Signature Date/Time: 09/11/2024/1:07:30 PM    Final    MR BRAIN WO CONTRAST Result Date: 09/09/2024 EXAM: MRI BRAIN WITHOUT CONTRAST 09/09/2024 04:24:52 PM TECHNIQUE: Multiplanar multisequence MRI of the head/brain was performed without the administration of intravenous contrast. COMPARISON: None available. CLINICAL HISTORY: Neuro deficit, acute, stroke suspected. Abnormal vision in the right eye beginning today. FINDINGS: BRAIN AND VENTRICLES: No acute infarct. No intracranial hemorrhage. No mass. No midline shift. No hydrocephalus. Minimal periventricular T2 hyperintensity is within normal  limits for age. The sella is unremarkable. Normal flow voids. ORBITS: No acute abnormality. SINUSES AND MASTOIDS: Mild mucosal thickening is present in the inferior left maxillary sinus. BONES AND SOFT TISSUES: Normal marrow signal. No acute soft tissue abnormality. IMPRESSION: 1. No acute intracranial abnormality explaining the right eye visual symptoms. Electronically signed by: Lonni Necessary MD 09/09/2024 04:55 PM EST RP Workstation: [id number]   CT ANGIO HEAD NECK W WO CM Addendum Date: 09/09/2024 ADDENDUM: The incorrect template was pulled. CT head without contrast was also performed the study is normal for age. ---------------------------------------------------- Electronically signed by: Lonni Necessary MD 09/09/2024 04:52 PM EST RP Workstation: [id number]   Result Date: 09/09/2024 * EXAM: CTA HEAD AND NECK WITHOUT AND WITH 09/09/2024 02:25:02 PM TECHNIQUE: CTA of the head and neck was performed without and with the administration of 75 mL iohexol (OMNIPAQUE) 350 MG/ML injection. Multiplanar 2D and/or 3D reformatted images are provided for review. Automated exposure control, iterative reconstruction, and/or weight based adjustment of the mA/kV was utilized to reduce the radiation dose to as low as reasonably achievable. Stenosis of the internal carotid arteries measured using NASCET criteria. COMPARISON: None available CLINICAL HISTORY: Neuro deficit, acute, stroke suspected. Abnormal vision in the right eye beginning today. FINDINGS: CTA NECK: AORTIC ARCH AND ARCH VESSELS: Sclerotic changes are present in the distal aortic arch. No focal aneurysm or stenosis is present. Right vessel origins are within normal limits. No dissection or arterial injury. CERVICAL CAROTID ARTERIES: Mild fibromuscular dysplasia (FMD) is present in the mid right internal carotid artery (ICA) raising concern  for FMD. No focal stenosis is present. More subtle irregularity is present in the mid left internal carotid  artery (ICA). No dissection or arterial injury. CERVICAL VERTEBRAL ARTERIES: The right vertebral artery is the dominant vessel. No dissection, arterial injury, or significant stenosis. LUNGS AND MEDIASTINUM: Unremarkable. SOFT TISSUES: Surrounds the roots of the residual left mandibular molar consistent with periapical abscess. BONES: No acute abnormality. CTA HEAD: ANTERIOR CIRCULATION: Atherosclerotic calcifications are present within the cavernous internal carotid arteries but actually without focal stenosis. No significant stenosis of the anterior cerebral arteries. No significant stenosis of the middle cerebral arteries. No aneurysm. POSTERIOR CIRCULATION: No significant stenosis of the posterior cerebral arteries. No significant stenosis of the basilar artery. No significant stenosis of the vertebral arteries. No aneurysm. OTHER: No dural venous sinus thrombosis on this non-dedicated study. IMPRESSION: 1. No large vessel occlusion, hemodynamically significant stenosis, or aneurysm in the head or neck. 2. Mild mid right ICA irregularity concerning for fibromuscular dysplasia, without focal stenosis. More subtle irregularity in the mid left ICA likely represents the same . 3. Periapical abscess surrounding the roots of the residual left mandibular molar. Electronically signed by: Lonni Necessary MD 09/09/2024 02:40 PM EST RP Workstation: [id number]     The results of significant diagnostics from this hospitalization (including imaging, microbiology, ancillary and laboratory) are listed below for reference.     Microbiology: No results found for this or any previous visit (from the past 240 hours).   Labs: BNP (last 3 results) No results for input(s): BNP in the last 8760 hours. Basic Metabolic Panel: Recent Labs  Lab 09/09/24 1324 09/11/24 0412  NA 141 139  K 4.1 3.3*  CL 104 105  CO2 26 24  GLUCOSE 101* 112*  BUN 15 15  CREATININE 0.86 0.83  CALCIUM  9.9 8.9  MG  --  2.0   Liver  Function Tests: Recent Labs  Lab 09/09/24 1324 09/11/24 0412  AST 24 17  ALT 13 13  ALKPHOS 99 72  BILITOT 0.6 0.9  PROT 7.9 6.6  ALBUMIN 4.4 3.6   No results for input(s): LIPASE, AMYLASE in the last 168 hours. No results for input(s): AMMONIA in the last 168 hours. CBC: Recent Labs  Lab 09/09/24 1324 09/11/24 0412  WBC 12.0* 9.6  NEUTROABS 8.5* 6.5  HGB 13.3 12.6  HCT 40.3 37.9  MCV 89.8 90.0  PLT 260 277   Cardiac Enzymes: No results for input(s): CKTOTAL, CKMB, CKMBINDEX, TROPONINI in the last 168 hours. BNP: Invalid input(s): POCBNP CBG: Recent Labs  Lab 09/09/24 1302  GLUCAP 97   D-Dimer No results for input(s): DDIMER in the last 72 hours. Hgb A1c Recent Labs    09/09/24 1930  HGBA1C 5.8*   Lipid Profile Recent Labs    09/11/24 0412  CHOL 134  HDL 42  LDLCALC 82  TRIG 49  CHOLHDL 3.2   Thyroid  function studies Recent Labs    09/09/24 1935  TSH 4.080   Anemia work up No results for input(s): VITAMINB12, FOLATE, FERRITIN, TIBC, IRON, RETICCTPCT in the last 72 hours. Urinalysis    Component Value Date/Time   COLORURINE COLORLESS (A) 09/09/2024 1539   APPEARANCEUR CLEAR 09/09/2024 1539   LABSPEC 1.044 (H) 09/09/2024 1539   PHURINE 6.5 09/09/2024 1539   GLUCOSEU NEGATIVE 09/09/2024 1539   HGBUR NEGATIVE 09/09/2024 1539   BILIRUBINUR NEGATIVE 09/09/2024 1539   KETONESUR NEGATIVE 09/09/2024 1539   PROTEINUR NEGATIVE 09/09/2024 1539   NITRITE NEGATIVE 09/09/2024 1539   LEUKOCYTESUR NEGATIVE 09/09/2024  1539   Sepsis Labs Recent Labs  Lab 09/09/24 1324 09/11/24 0412  WBC 12.0* 9.6   Microbiology No results found for this or any previous visit (from the past 240 hours).   Time coordinating discharge:  I have spent 35 minutes face to face with the patient and on the ward discussing the patients care, assessment, plan and disposition with other care givers. >50% of the time was devoted counseling the  patient about the risks and benefits of treatment/Discharge disposition and coordinating care.   SIGNED:   Burgess JAYSON Dare, MD  [facility] 09/11/2024, 1:11 PM   If 7PM-7AM, please contact night-coverage

## 2024-09-11 NOTE — H&P (Signed)
 History and Physical      Patricia Little FMW:[id number] DOB: November 25, 1950 DOA: 09/09/2024; DOS: 09/11/2024  PCP: Pcp, No (will further assess) Patient coming from: home   I have personally briefly reviewed patient's old medical records in [facility]  Chief Complaint: Vision loss in right eye  HPI: Patricia Little is a 72 y.o. female with medical history significant for essential hypertension, hyperlipidemia, who is admitted to [facility] on 09/09/2024 by way of transfer from [facility] with vision loss in the right eye after presenting from home to [facility] c/o such.   The patient reports that she went to sleep on 09/08/2024 around 2300 in her normal state of health, noting her baseline visual acuity and no acute visual abnormalities at that time.  However, when she awoke the following morning, on 09/09/2024, she noted interval development of complete vision loss involving the right eye, while noting preservation of her vision in all fields involving the left eye.  Denied any associated headache, acute focal weakness, acute focal numbness, paresthesias, facial droop, dysarthria, expressive aphasia, dysphagia, or vertigo.  She noted gradual ensuing improvement in the vision associated with her right eye, now noting her vision in the right eye has returned to her baseline, noting no residual visual field deficits, blurry vision, or diplopia.   She denies any known history of complex migraines, multiple sclerosis, or vasculitis.  No known history of optic neuritis.   Medical history is notable for essential hypertension as well as hyperlipidemia.  She is not on any antiplatelet medications as an outpatient.  No known history of paroxysmal atrial fibrillation or any known history of underlying diabetes.  The patient conveys a history to NSAIDs, noting that she has previously experienced a rash associated with this class of medications.    [facility] ED Course:  Vital signs in  the ED were notable for the following: Afebrile; heart rates in the 60s to 70s; systolic blood pressures in the 1 teens to 160s; respiratory rate 15-21, oxygen saturation 95 to 100% on room air.  Labs were notable for the following: CMP was notable for the following: Sodium 141, bicarbonate 26, creatinine 0.86, glucose 101, liver enzymes are within normal limits.  Hemoglobin A1c 5.8%, TSH 4.08, free T40.73.  CBC notable for white cell count 12,000, hemoglobin 13.3, plate count 739.  CRP less than 0.5, while ESR was 16.  ANA panel has been ordered, with result currently pending.  INR 0.9.  Urinalysis showed no white blood cells, leukocyte esterase/nitrate negative and no evidence of bacteria, while noting associated specific gravity of 1.044 and no evidence of protein.  Urinary drug screen is pan negative.  Per my interpretation, EKG in ED demonstrated the following: Sinus rhythm with heart rate 83, normal intervals, nonspecific T wave inversion limited to lead III, no evidence of ST changes, including no evidence of ST elevation.  Imaging in the ED, per corresponding formal radiology read, was notable for the following: Noncontrast CT head showed no evidence of acute intracranial process, including no evidence of intracranial injury evidence of acute infarct.  CTA head and neck showed no evidence of large vessel occlusion or any evidence of hemodynamically significant stenosis nor any evidence of aneurysm will or dissection.  This imaging also showed evidence of periapical abscess of left mandibular molar.  MRI brain without contrast showed no evidence of acute intracranial process, including no evidence of acute infarct or any evidence of mass, mass effect, midline shift, or cerebral edema.  EDP d/w on-call neurology, Dr. Voncile, who recommended hospitalist admission to [facility] for further evaluation of potential ophthalmic TIA, including evaluation of modifiable ischemic cva risk factors, and conveyed that  neurology will consult. Dr. Arora has recommended echo, permissive htn, and 3 week course of DAP.  While in the ED, the following were administered: Augmentin x 1 dose, Plavix 300 mg p.o. x 1 dose, methimazole  5 mg p.o. x 1 dose, Paxil  25 mg p.o. x 1 dose, Crestor  10 mg p.o. x 1 dose.  Subsequently, the patient was admitted to [facility] for further evaluation management of mono ocular right eye vision loss, now resolved, including further eval evaluation of potential ophthalmic TIA.     Review of Systems: As per HPI otherwise 10 point review of systems negative.   Past Medical History:  Diagnosis Date   Essential hypertension    GAD (generalized anxiety disorder)    HLD (hyperlipidemia)    Hyperthyroidism     History reviewed. No pertinent surgical history.  Social History:  reports that she has never smoked. She has never used smokeless tobacco. She reports that she does not currently use alcohol. She reports that she does not use drugs.   Allergies  Allergen Reactions   Miconazole Nitrate Itching    Unknown reaction   Nsaids Rash and Dermatitis    History reviewed. No pertinent family history.  Family history reviewed and not pertinent    Prior to Admission medications   Medication Sig Start Date End Date Taking? Authorizing Provider  amoxicillin  (AMOXIL ) 250 MG capsule Take 1 capsule (250 mg total) by mouth every 6 hours until gone . Start 4 days before appointment 12/25/21     HYDROcodone -acetaminophen  (NORCO/VICODIN) 5-325 MG tablet Take 1 tablet by mouth every 4-6 hours as needed for pain 01/08/22     influenza vaccine adjuvanted (FLUAD) 0.5 ML injection Inject into the muscle. 08/04/21   Luiz Channel, MD  losartan -hydrochlorothiazide  (HYZAAR ) 50-12.5 MG tablet Take 1 tablet by mouth daily. 10/20/21     methimazole  (TAPAZOLE ) 5 MG tablet TAKE 1 TABLET BY MOUTH ONCE DAILY. 06/08/20 06/08/21  Norleen Silver BROCKS, FNP  methimazole  (TAPAZOLE ) 5 MG tablet Take 1 tablet (5 mg  total) by mouth daily. 04/04/21     methimazole  (TAPAZOLE ) 5 MG tablet Take 1 tablet (5 mg total) by mouth daily. 10/20/21     PARoxetine  (PAXIL  CR) 25 MG 24 hr tablet Take 1 tablet (25 mg total) by mouth daily. 04/04/21     PARoxetine  (PAXIL  CR) 25 MG 24 hr tablet Take 1 tablet (25 mg total) by mouth daily. 10/20/21     PARoxetine  (PAXIL -CR) 25 MG 24 hr tablet TAKE 1 TABLET BY MOUTH ONCE DAILY. 06/08/20 06/08/21  Norleen Silver BROCKS, FNP  penicillin  v potassium (VEETID) 500 MG tablet Take 1 tablet (500 mg total) by mouth 4 (four) times daily until complete 01/08/22     rosuvastatin  (CRESTOR ) 10 MG tablet Take 1 tablet (10 mg total) by mouth at bedtime. 12/01/21        Objective    Physical Exam: Vitals:   09/10/24 2111 09/10/24 2340 09/11/24 0126 09/11/24 0140  BP:  (!) 155/61 (!) 153/80   Pulse:  61 72   Resp:  16    Temp: 98.3 F (36.8 C)  97.6 F (36.4 C)   TempSrc:      SpO2:  98% 97%   Weight:    65.3 kg  Height:    4' 11 (1.499 m)  General: appears to be stated age; alert, oriented Skin: warm, dry, no rash Head:  AT/[place] Mouth:  Oral mucosa membranes appear moist, normal dentition Neck: supple; trachea midline Heart:  RRR; did not appreciate any M/R/G Lungs: CTAB, did not appreciate any wheezes, rales, or rhonchi Abdomen: + BS; soft, ND, NT Vascular: 2+ pedal pulses b/l; 2+ radial pulses b/l Extremities: no peripheral edema, no muscle wasting       Labs on Admission: I have personally reviewed following labs and imaging studies  CBC: Recent Labs  Lab 09/09/24 1324  WBC 12.0*  NEUTROABS 8.5*  HGB 13.3  HCT 40.3  MCV 89.8  PLT 260   Basic Metabolic Panel: Recent Labs  Lab 09/09/24 1324  NA 141  K 4.1  CL 104  CO2 26  GLUCOSE 101*  BUN 15  CREATININE 0.86  CALCIUM  9.9   GFR: Estimated Creatinine Clearance: 47.1 mL/min (by C-G formula based on SCr of 0.86 mg/dL). Liver Function Tests: Recent Labs  Lab 09/09/24 1324  AST 24  ALT 13  ALKPHOS 99   BILITOT 0.6  PROT 7.9  ALBUMIN 4.4   No results for input(s): LIPASE, AMYLASE in the last 168 hours. No results for input(s): AMMONIA in the last 168 hours. Coagulation Profile: Recent Labs  Lab 09/09/24 1324  INR 0.9   Cardiac Enzymes: No results for input(s): CKTOTAL, CKMB, CKMBINDEX, TROPONINI in the last 168 hours. BNP (last 3 results) No results for input(s): PROBNP in the last 8760 hours. HbA1C: Recent Labs    09/09/24 1930  HGBA1C 5.8*   CBG: Recent Labs  Lab 09/09/24 1302  GLUCAP 97   Lipid Profile: No results for input(s): CHOL, HDL, LDLCALC, TRIG, CHOLHDL, LDLDIRECT in the last 72 hours. Thyroid  Function Tests: Recent Labs    09/09/24 1935  TSH 4.080  FREET4 0.73   Anemia Panel: No results for input(s): VITAMINB12, FOLATE, FERRITIN, TIBC, IRON, RETICCTPCT in the last 72 hours. Urine analysis:    Component Value Date/Time   COLORURINE COLORLESS (A) 09/09/2024 1539   APPEARANCEUR CLEAR 09/09/2024 1539   LABSPEC 1.044 (H) 09/09/2024 1539   PHURINE 6.5 09/09/2024 1539   GLUCOSEU NEGATIVE 09/09/2024 1539   HGBUR NEGATIVE 09/09/2024 1539   BILIRUBINUR NEGATIVE 09/09/2024 1539   KETONESUR NEGATIVE 09/09/2024 1539   PROTEINUR NEGATIVE 09/09/2024 1539   NITRITE NEGATIVE 09/09/2024 1539   LEUKOCYTESUR NEGATIVE 09/09/2024 1539    Radiological Exams on Admission: MR BRAIN WO CONTRAST Result Date: 09/09/2024 EXAM: MRI BRAIN WITHOUT CONTRAST 09/09/2024 04:24:52 PM TECHNIQUE: Multiplanar multisequence MRI of the head/brain was performed without the administration of intravenous contrast. COMPARISON: None available. CLINICAL HISTORY: Neuro deficit, acute, stroke suspected. Abnormal vision in the right eye beginning today. FINDINGS: BRAIN AND VENTRICLES: No acute infarct. No intracranial hemorrhage. No mass. No midline shift. No hydrocephalus. Minimal periventricular T2 hyperintensity is within normal limits for age. The  sella is unremarkable. Normal flow voids. ORBITS: No acute abnormality. SINUSES AND MASTOIDS: Mild mucosal thickening is present in the inferior left maxillary sinus. BONES AND SOFT TISSUES: Normal marrow signal. No acute soft tissue abnormality. IMPRESSION: 1. No acute intracranial abnormality explaining the right eye visual symptoms. Electronically signed by: Lonni Necessary MD 09/09/2024 04:55 PM EST RP Workstation: [id number]   CT ANGIO HEAD NECK W WO CM Addendum Date: 09/09/2024 ADDENDUM #1 ** ADDENDUM: The incorrect template was pulled. CT head without contrast was also performed the study is normal for age. ---------------------------------------------------- Electronically signed by: Lonni Necessary MD 09/09/2024 04:52 PM  EST RP Workstation: [id number]   Result Date: 09/09/2024 ** ORIGINAL REPORT ** EXAM: CTA HEAD AND NECK WITHOUT AND WITH 09/09/2024 02:25:02 PM TECHNIQUE: CTA of the head and neck was performed without and with the administration of 75 mL iohexol (OMNIPAQUE) 350 MG/ML injection. Multiplanar 2D and/or 3D reformatted images are provided for review. Automated exposure control, iterative reconstruction, and/or weight based adjustment of the mA/kV was utilized to reduce the radiation dose to as low as reasonably achievable. Stenosis of the internal carotid arteries measured using NASCET criteria. COMPARISON: None available CLINICAL HISTORY: Neuro deficit, acute, stroke suspected. Abnormal vision in the right eye beginning today. FINDINGS: CTA NECK: AORTIC ARCH AND ARCH VESSELS: Sclerotic changes are present in the distal aortic arch. No focal aneurysm or stenosis is present. Right vessel origins are within normal limits. No dissection or arterial injury. CERVICAL CAROTID ARTERIES: Mild fibromuscular dysplasia (FMD) is present in the mid right internal carotid artery (ICA) raising concern for FMD. No focal stenosis is present. More subtle irregularity is present in the mid left  internal carotid artery (ICA). No dissection or arterial injury. CERVICAL VERTEBRAL ARTERIES: The right vertebral artery is the dominant vessel. No dissection, arterial injury, or significant stenosis. LUNGS AND MEDIASTINUM: Unremarkable. SOFT TISSUES: Surrounds the roots of the residual left mandibular molar consistent with periapical abscess. BONES: No acute abnormality. CTA HEAD: ANTERIOR CIRCULATION: Atherosclerotic calcifications are present within the cavernous internal carotid arteries but actually without focal stenosis. No significant stenosis of the anterior cerebral arteries. No significant stenosis of the middle cerebral arteries. No aneurysm. POSTERIOR CIRCULATION: No significant stenosis of the posterior cerebral arteries. No significant stenosis of the basilar artery. No significant stenosis of the vertebral arteries. No aneurysm. OTHER: No dural venous sinus thrombosis on this non-dedicated study. IMPRESSION: 1. No large vessel occlusion, hemodynamically significant stenosis, or aneurysm in the head or neck. 2. Mild mid right ICA irregularity concerning for fibromuscular dysplasia, without focal stenosis. More subtle irregularity in the mid left ICA likely represents the same . 3. Periapical abscess surrounding the roots of the residual left mandibular molar. Electronically signed by: Lonni Necessary MD 09/09/2024 02:40 PM EST RP Workstation: [id number]      Assessment/Plan   Principal Problem:   Vision loss of right eye Active Problems:   Leukocytosis   Dental abscess   History of essential hypertension   HLD (hyperlipidemia)   History of hyperthyroidism      #) Acute monocular vision loss in right eye: Awoke on the morning of 09/09/2024 with interval development of complete vision loss limited to the right eye after going to sleep around 2300 on 09/08/2024 in her normal state of health without any acute visual abnormalities.  She has since doing gradual improvement in the  vision in her right eye, which is now completely returned to baseline, with the patient denying any residual acute visual abnormalities involving the right eye.   CT head showed no evidence of acute intracranial process, while CTA head and neck showed no evidence of large vessel occlusion, hemodynamically significant stenosis nor any evidence of aneurysm/dissection.  Additionally, MRI brain without contrast showed no evidence of acute process, including no evidence of acute infarct or any evidence of cerebral edema.   EDP d/w on-call neurology, Dr. Voncile, who recommended hospitalist admission to [facility] for further evaluation of potential ophthalmic TIA, including evaluation of modifiable ischemic cva risk factors, and conveyed that neurology will consult. Dr. Arora has recommended echo, permissive htn, and 3 week course of DAP.  In the setting of patient's report of history of allergy to NSAIDs, will refrain from aspirin at this time.  She is noted to have been loaded with Plavix 300 mg p.o. x 1 dose at [facility], and will continue course of Plavix 75 mg p.o. daily for now.   Differential would include TIA involving distribution anterior to the chiasm.  Retinal detachment appears less likely given spontaneous interval complete resolution of her visual abnormality.  Additionally, no acute retinal artery thrombus also appears less likely given the above history.  No known history of multiple sclerosis nor any known history of optic neuritis, with the latter, as well as, large vessel vasculitis appearing less likely given no elevation in inflammatory markers, noting presenting CRP of less than 0.5 We will ESR not elevated at 16.  Differential would also include complex migraine.  Would likely ultimately benefit from ophthalmologic exam as well.    Will pursue further evaluation for modifiable ischemic CVA risk factors, as further outlined below, noting that she underwent hemoglobin A1c evaluation on 1115, which  showed value of 5.8%.  Per neurology's recommendations, will continue with 72-hour observation of permissive hypertension, with this period of permissive hypertension to end at 2300 on 09/11/2024.  Plan: Cardiology to formally consult.  Monitor on telemetry.  Check lipid panel.  PT/OT consults ordered.  Echocardiogram in the morning.  Permissive hypertension until 2300 on 09/11/2024, with prn IV hydralazine for systolic blood pressure greater than 220 mmHg until that time.  While observing permissive hypertension, will hold home losartan  and HCTZ, as above.  Plavix 75 mg p.o. daily x 3-week course, as above.  Follow-up result of ANA panel, as above.                   #) Periapical abscess of left mandibular molar: Noted on CTA head and neck.  Patient has mild elevation of white blood cell count of 12,000, in the absence of LipoSil count greater than 12,000 and in the absence of objective fever, SIRS criteria are not met for sepsis at this time.  No evidence of hypotension.  Was started on Augmentin and [facility].   Plan: Continue Augmentin, as above.  As needed acetaminophen .  Repeat CBC in the morning.                        #) Hyperlipidemia: documented h/o such. On rosuvastatin  10 mg p.o. daily as outpatient.   Plan: continue home statin.  Follow-up results of lipid panel, which has been ordered with this morning's labs.                        #) Essential Hypertension: documented h/o such, with outpatient antihypertensive regimen including losartan , HCTZ.  SBP's in the ED today: 1 teens 160s mmHg. in the context of presenting workup for potential ophthalmic TIA, will observe permissive hypertension until 2300 on 09/11/2024, during which time we will hold home antihypertensive medications.   Plan: Close monitoring of subsequent BP via routine VS. permissive hypertension, as above.  As needed IV hydralazine for systolic blood pressure  greater than 220 mmHg until 2300 on 11/17.  Hold home losartan  and HCTZ for now.  Monitor on telemetry.  Monitor strict I's and O's and daily weights.                       #) Hyperthyroidism: Reported history of such, on methimazole  as an  outpatient.  Orders to resume outpatient methimazole  replaced at [facility].   Plan: Continue outpatient methimazole , as above.         DVT prophylaxis: SCD's   Code Status: Full code Family Communication: none Disposition Plan: Per Rounding Team Consults called: EDP at [facility] d/w on-call neurology, Dr. Voncile, who recommended hospitalist admission to [facility] for further evaluation of potential ophthalmic TIA, including evaluation of modifiable ischemic cva risk factors, and conveyed that neurology will consult. Dr. Arora has recommended echo, permissive htn, and 3 week course of DAP. I've notified on-call neurology, Dr. Vanessa, of the pt's arrival at [facility]. ;  Admission status: inpatient     I SPENT GREATER THAN 75  MINUTES IN CLINICAL CARE TIME/MEDICAL DECISION-MAKING IN COMPLETING THIS ADMISSION.      Eva NOVAK Asta Corbridge DO [facility]  From 7PM - 7AM   09/11/2024, 2:52 AM

## 2024-09-11 NOTE — Progress Notes (Signed)
 Transition of Care [facility]) - Inpatient Brief Assessment   Patient Details  Name: Patricia Little MRN: [id number] Date of Birth: 1951/07/28  [facility] CM/SW Contact:    Rosaline JONELLE Joe, RN Phone Number: 09/11/2024, 2:53 PM   Clinical Narrative: Patient admitted with visual disturbances.  Patient is independent and plans to return home when stable today.  Moon provided and Code 44 completed at the bedside.  Bedside nursing is aware and will discharge the patient home with spouse.   Transition of Care Asessment: Insurance and Status: (P) Insurance coverage has been reviewed Patient has primary care physician: (P) Yes Home environment has been reviewed: (P) from home with spouse Prior level of function:: (P) INdependent Prior/Current Home Services: (P) No current home services Social Drivers of Health Review: (P) SDOH reviewed interventions complete Readmission risk has been reviewed: (P) Yes Transition of care needs: (P) no transition of care needs at this time

## 2024-09-11 NOTE — Care Management CC44 (Cosign Needed)
 Condition Code 44 Documentation Completed  Patient Details  Name: VENUS GILLES MRN: [id number] Date of Birth: [birth year]   Condition Code 44 given:  Yes Patient signature on Condition Code 44 notice:  Yes Documentation of 2 MD's agreement:  Yes Code 44 added to claim:  Yes    Rosaline JONELLE Joe, RN 09/11/2024, 2:49 PM

## 2024-09-11 NOTE — Progress Notes (Signed)
 PT Cancellation Note  Patient Details Name: Patricia Little MRN: [id number] DOB: [birth year]   Cancelled Treatment:    Reason Eval/Treat Not Completed: PT screened, no needs identified, will sign off   [name] 09/11/2024, 8:59 AM [name],PT Acute Rehab Services [phone]

## 2024-09-12 LAB — ANTINUCLEAR ANTIBODIES, IFA: ANA Ab, IFA: NEGATIVE

## 2024-09-25 ENCOUNTER — Telehealth: Payer: Self-pay

## 2024-09-25 NOTE — Telephone Encounter (Signed)
 Received vm regarding ophthalmology referral. Patient was unable to f/u with Dr. Fate. CM left return vm with contact information for [facility] [phone]) who appears to be on call currently.   Merilee Batty, MSN, RN Case Management [phone]
